# Patient Record
Sex: Female | Born: 1965 | Race: Black or African American | Hispanic: No | Marital: Married | Smoking: Never smoker
Health system: Southern US, Community
[De-identification: ages and names within clinical notes are randomized; demographics above are authoritative.]

## PROBLEM LIST (undated history)

## (undated) DIAGNOSIS — K219 Gastro-esophageal reflux disease without esophagitis: Secondary | ICD-10-CM

## (undated) DIAGNOSIS — J45909 Unspecified asthma, uncomplicated: Secondary | ICD-10-CM

## (undated) DIAGNOSIS — D649 Anemia, unspecified: Secondary | ICD-10-CM

## (undated) DIAGNOSIS — R7303 Prediabetes: Secondary | ICD-10-CM

## (undated) DIAGNOSIS — I10 Essential (primary) hypertension: Secondary | ICD-10-CM

## (undated) DIAGNOSIS — Z9109 Other allergy status, other than to drugs and biological substances: Secondary | ICD-10-CM

## (undated) HISTORY — DX: Anemia, unspecified: D64.9

## (undated) HISTORY — DX: Prediabetes: R73.03

## (undated) HISTORY — PX: UPPER GI ENDOSCOPY: SHX6162

## (undated) HISTORY — PX: OTHER SURGICAL HISTORY: SHX169

---

## 1993-04-22 HISTORY — PX: TUBAL LIGATION: SHX77

## 2001-03-25 ENCOUNTER — Encounter: Admission: RE | Admit: 2001-03-25 | Discharge: 2001-06-23 | Payer: Self-pay | Admitting: Internal Medicine

## 2001-06-26 ENCOUNTER — Emergency Department (HOSPITAL_COMMUNITY): Admission: EM | Admit: 2001-06-26 | Discharge: 2001-06-26 | Payer: Self-pay | Admitting: Emergency Medicine

## 2003-08-26 ENCOUNTER — Ambulatory Visit (HOSPITAL_COMMUNITY): Admission: RE | Admit: 2003-08-26 | Discharge: 2003-08-26 | Payer: Self-pay | Admitting: Obstetrics

## 2003-12-01 ENCOUNTER — Encounter: Admission: RE | Admit: 2003-12-01 | Discharge: 2003-12-01 | Payer: Self-pay | Admitting: Internal Medicine

## 2004-05-03 ENCOUNTER — Encounter: Admission: RE | Admit: 2004-05-03 | Discharge: 2004-05-03 | Payer: Self-pay | Admitting: Internal Medicine

## 2005-03-01 ENCOUNTER — Ambulatory Visit: Payer: Self-pay | Admitting: Internal Medicine

## 2005-03-22 ENCOUNTER — Ambulatory Visit: Payer: Self-pay | Admitting: Internal Medicine

## 2005-12-20 ENCOUNTER — Encounter (INDEPENDENT_AMBULATORY_CARE_PROVIDER_SITE_OTHER): Payer: Self-pay | Admitting: *Deleted

## 2005-12-20 ENCOUNTER — Ambulatory Visit (HOSPITAL_COMMUNITY): Admission: RE | Admit: 2005-12-20 | Discharge: 2005-12-20 | Payer: Self-pay | Admitting: Obstetrics

## 2005-12-30 ENCOUNTER — Emergency Department (HOSPITAL_COMMUNITY): Admission: EM | Admit: 2005-12-30 | Discharge: 2005-12-31 | Payer: Self-pay | Admitting: Emergency Medicine

## 2007-09-02 ENCOUNTER — Emergency Department (HOSPITAL_COMMUNITY): Admission: EM | Admit: 2007-09-02 | Discharge: 2007-09-03 | Payer: Self-pay | Admitting: Emergency Medicine

## 2009-04-22 HISTORY — PX: ABLATION: SHX5711

## 2009-06-16 ENCOUNTER — Ambulatory Visit: Payer: Self-pay | Admitting: Obstetrics

## 2010-06-06 ENCOUNTER — Emergency Department (HOSPITAL_COMMUNITY)
Admission: EM | Admit: 2010-06-06 | Discharge: 2010-06-06 | Disposition: A | Discharge status: Home / Self Care | Attending: Emergency Medicine | Admitting: Emergency Medicine

## 2010-06-06 ENCOUNTER — Emergency Department (HOSPITAL_COMMUNITY)

## 2010-06-06 DIAGNOSIS — N83209 Unspecified ovarian cyst, unspecified side: Secondary | ICD-10-CM | POA: Insufficient documentation

## 2010-06-06 DIAGNOSIS — E119 Type 2 diabetes mellitus without complications: Secondary | ICD-10-CM | POA: Insufficient documentation

## 2010-06-06 DIAGNOSIS — R109 Unspecified abdominal pain: Secondary | ICD-10-CM | POA: Insufficient documentation

## 2010-06-06 LAB — URINE MICROSCOPIC-ADD ON

## 2010-06-06 LAB — BASIC METABOLIC PANEL
BUN: 13 mg/dL (ref 6–23)
CO2: 25 mEq/L (ref 19–32)
Calcium: 9.1 mg/dL (ref 8.4–10.5)
Creatinine, Ser: 0.89 mg/dL (ref 0.4–1.2)
GFR calc non Af Amer: >60 mL/min (ref >60)
Glucose, Bld: 128 mg/dL — ABNORMAL HIGH (ref 70–99)
Sodium: 137 mEq/L (ref 135–145)

## 2010-06-06 LAB — CBC
HCT: 34 % — ABNORMAL LOW (ref 36.0–46.0)
Hemoglobin: 10.5 g/dL — ABNORMAL LOW (ref 12.0–15.0)
MCH: 22.5 pg — ABNORMAL LOW (ref 26.0–34.0)
MCHC: 30.9 g/dL (ref 30.0–36.0)
RDW: 14.2 % (ref 11.5–15.5)

## 2010-06-06 LAB — HEPATIC FUNCTION PANEL
Bilirubin, Direct: 0.1 mg/dL (ref 0.0–0.3)
Indirect Bilirubin: 0.1 mg/dL — ABNORMAL LOW (ref 0.3–0.9)
Total Bilirubin: 0.2 mg/dL — ABNORMAL LOW (ref 0.3–1.2)

## 2010-06-06 LAB — DIFFERENTIAL
Eosinophils Relative: 5 % (ref 0–5)
Lymphs Abs: 1.9 10*3/uL (ref 0.7–4.0)
Monocytes Absolute: 0.3 10*3/uL (ref 0.1–1.0)
Monocytes Relative: 5 % (ref 3–12)
Neutro Abs: 3.2 10*3/uL (ref 1.7–7.7)

## 2010-06-06 LAB — URINALYSIS, ROUTINE W REFLEX MICROSCOPIC
Bilirubin Urine: NEGATIVE
Ketones, ur: NEGATIVE mg/dL
Nitrite: NEGATIVE
Protein, ur: NEGATIVE mg/dL
Specific Gravity, Urine: 1.005 (ref 1.005–1.030)
Urobilinogen, UA: 0.2 mg/dL (ref 0.0–1.0)

## 2010-06-06 LAB — LIPASE, BLOOD: Lipase: 19 U/L (ref 11–59)

## 2010-06-06 LAB — PREGNANCY, URINE: Preg Test, Ur: NEGATIVE

## 2010-06-06 LAB — GLUCOSE, CAPILLARY: Glucose-Capillary: 110 mg/dL — ABNORMAL HIGH (ref 70–99)

## 2010-06-06 MED ORDER — IOHEXOL 300 MG/ML  SOLN
100.0000 mL | Freq: Once | INTRAMUSCULAR | Status: AC | PRN
  Administered 2010-06-06: 100 mL via INTRAVENOUS

## 2010-09-07 NOTE — Op Note (Signed)
NAME:  Rose Scott, Rose Scott              ACCOUNT NO.:  [id number]   MEDICAL RECORD NO.:  [id number]          PATIENT TYPE:  AMB   LOCATION:  SDC                           FACILITY:  [facility]   PHYSICIAN:  Charles A. Clearance Coots, M.D.DATE OF BIRTH:  Nov 22, 1965   DATE OF PROCEDURE:  12/20/2005  DATE OF DISCHARGE:  12/20/2005                                 OPERATIVE REPORT   PREOPERATIVE DIAGNOSIS:  Menorrhagia.   POSTOPERATIVE DIAGNOSIS:  Menorrhagia.   PROCEDURE:  1. Hysteroscopy.  2. D&C.  3. NovaSure bipolar endometrial ablation.   SURGEON:  Charles A. Clearance Coots, M.D.   ANESTHESIA:  General.   COMPLICATIONS:  None.   SPECIMEN:  Endometrial curettings.   OPERATION:  The patient was brought to the operating room and after  satisfactory general endotracheal anesthesia the vagina was prepped and  draped in the usual sterile fashion.  Urinary bladder was emptied of  approximately 200 mL of clear urine.  Bi-manual examination revealed the  uterus to be in mid position.  A sterile speculum was inserted in the  vaginal vault.  The cervix was isolated.  The anterior lip of the cervix was  grasped with a single-tooth tenaculum.  A Hegar dilator was then advanced  through the cervical os to the internal os of the cervical canal to 4 cm.  The uterus was then sounded to 9 cm, leaving a cavity length of 5 cm.  The  cervix was then dilated to a #25 Pratt dilator.  The hysteroscope was  inserted through the cervical os and into the endometrial cavity and a  hysteroscopic survey revealed the endometrial cavity to be clean, no  evidence of polyps or fibroids.  The hysteroscope was removed.  A small  serrated curet was then introduced into the uterine cavity and curettage of  the endometrium was done in routine fashion and specimen was submitted to  Pathology for evaluation.  The NovaSure bipolar instrument was then  introduced into the uterine cavity, into the fundus, and bipolar ablation  was done in  routine fashion at a power of 113 watts for 112 seconds.  The  instrument was then removed.  Follow up hysteroscopic survey of the cavity  revealed adequate ablation.  All instruments were then retired.  The patient  tolerated the procedure well.  He was transported to the recovery room in  satisfactory condition.      Charles A. Clearance Coots, M.D.  Electronically Signed    CAH/MEDQ  D:  12/20/2005  T:  12/22/2005  Job:  [id number]

## 2011-09-24 ENCOUNTER — Other Ambulatory Visit: Payer: Self-pay | Admitting: Obstetrics

## 2011-09-24 DIAGNOSIS — Z1231 Encounter for screening mammogram for malignant neoplasm of breast: Secondary | ICD-10-CM

## 2011-10-17 ENCOUNTER — Ambulatory Visit (HOSPITAL_COMMUNITY)
Admission: RE | Admit: 2011-10-17 | Discharge: 2011-10-17 | Disposition: A | Discharge status: Home / Self Care | Source: Ambulatory Visit | Attending: Obstetrics | Admitting: Obstetrics

## 2011-10-17 DIAGNOSIS — Z1231 Encounter for screening mammogram for malignant neoplasm of breast: Secondary | ICD-10-CM | POA: Insufficient documentation

## 2012-04-21 ENCOUNTER — Ambulatory Visit (INDEPENDENT_AMBULATORY_CARE_PROVIDER_SITE_OTHER): Admitting: Family Medicine

## 2012-04-21 VITALS — BP 129/78 | HR 78 | Temp 98.9°F | Resp 17 | Ht 64.5 in | Wt 194.0 lb

## 2012-04-21 DIAGNOSIS — R05 Cough: Secondary | ICD-10-CM

## 2012-04-21 DIAGNOSIS — R509 Fever, unspecified: Secondary | ICD-10-CM

## 2012-04-21 DIAGNOSIS — R5381 Other malaise: Secondary | ICD-10-CM

## 2012-04-21 DIAGNOSIS — R7302 Impaired glucose tolerance (oral): Secondary | ICD-10-CM

## 2012-04-21 MED ORDER — CEFDINIR 300 MG PO CAPS: 300.0000 mg | ORAL_CAPSULE | Freq: Two times a day (BID) | ORAL | Status: DC

## 2012-04-21 NOTE — Progress Notes (Signed)
[facility] [address], [place] [address] [phone]  Date:  04/21/2012   Name:  Rose Scott   DOB:  1965-09-27   MRN:  [id number]  PCP:  Gwynneth Aliment, MD    Chief Complaint: Generalized Body Aches, Sore Throat, Cough and Dizziness   History of Present Illness:  Rose Scott is a 46 y.o. very pleasant female patient who presents with the following:  She is here today with about a week of flu- like symptoms. She has noted chills, aches, coughing up phlegm, feeling lightheaded. She has noted a possible fever off and on.  She is using mucinex but it makes her feel dizzy- she is not really able to use it regulalry.    Her daughter has been ill with a ST as well  Her glucose at home is running around 113- she last checked it a couple of days ago- history of glucose intolerance but not DM She is a Corporate treasurer  There is no problem list on file for this patient.   History reviewed. No pertinent past medical history.  History reviewed. No pertinent past surgical history.  History  Substance Use Topics  . Smoking status: Never Smoker   . Smokeless tobacco: Not on file  . Alcohol Use: No    History reviewed. No pertinent family history.  No Known Allergies  Medication list has been reviewed and updated.  No current outpatient prescriptions on file prior to visit.    Review of Systems:  As per HPI- otherwise negative.   Physical Examination: Filed Vitals:   04/21/12 1056  BP: 129/78  Pulse: 78  Temp: 98.9 F (37.2 C)  Resp: 17   Filed Vitals:   04/21/12 1056  Height: 5' 4.5" (1.638 m)  Weight: 194 lb (87.998 kg)   Body mass index is 32.79 kg/(m^2). Ideal Body Weight: Weight in (lb) to have BMI = 25: 147.6   GEN: WDWN, NAD, Non-toxic, A & O x 3, overweight HEENT: Atraumatic, Normocephalic. Neck supple. No masses, No LAD. Bilateral TM wnl, oropharynx normal.  PEERL,EOMI.   Ears and Nose: No external  deformity. CV: RRR, No M/G/R. No JVD. No thrill. No extra heart sounds. PULM: CTA B, no wheezes, crackles, rhonchi. No retractions. No resp. distress. No accessory muscle use. ABD: S, NT, ND EXTR: No c/c/e NEURO Normal gait.  PSYCH: Normally interactive. Conversant. Not depressed or anxious appearing.  Calm demeanor.   Results for orders placed in visit on 04/21/12  POCT INFLUENZA A/B      Component Value Range   Influenza A, POC Negative     Influenza B, POC Negative      Assessment and Plan: 1. Malaise  POCT Influenza A/B  2. Glucose intolerance (impaired glucose tolerance)    3. Fever    4. Cough  cefdinir (OMNICEF) 300 MG capsule   Treat for bronchitis with omnicef.  Stop using mucinex (day and night combo cold prep) as it causes dizziness, but can use ibuprofen as needed for aches.    See patient instructions for more details.     Abbe Amsterdam, MD

## 2012-04-21 NOTE — Patient Instructions (Addendum)
Use the antibiotic as directed.  Drink plenty of fluids and let me know if you are not better in the next couple of days- Sooner if worse.

## 2013-01-25 ENCOUNTER — Encounter: Payer: Self-pay | Admitting: Obstetrics

## 2013-01-25 ENCOUNTER — Other Ambulatory Visit: Payer: Self-pay | Admitting: *Deleted

## 2013-01-25 ENCOUNTER — Ambulatory Visit (INDEPENDENT_AMBULATORY_CARE_PROVIDER_SITE_OTHER): Admitting: Obstetrics

## 2013-01-25 VITALS — BP 136/83 | HR 73 | Temp 98.6°F | Ht 64.0 in | Wt 196.2 lb

## 2013-01-25 DIAGNOSIS — Z01419 Encounter for gynecological examination (general) (routine) without abnormal findings: Secondary | ICD-10-CM

## 2013-01-25 DIAGNOSIS — N83209 Unspecified ovarian cyst, unspecified side: Secondary | ICD-10-CM

## 2013-01-25 DIAGNOSIS — Z803 Family history of malignant neoplasm of breast: Secondary | ICD-10-CM

## 2013-01-25 DIAGNOSIS — Z0000 Encounter for general adult medical examination without abnormal findings: Secondary | ICD-10-CM

## 2013-01-25 LAB — HIV ANTIBODY (ROUTINE TESTING W REFLEX): HIV: NONREACTIVE

## 2013-01-25 NOTE — Progress Notes (Signed)
.   Subjective:     Rose Scott is a 47 y.o. female here for a routine exam.  Current complaints: Right sided pain from an ovarian cyst.  She had an ultrasound a couple of weeks ago..  Personal health questionnaire reviewed: no. GAIL risk assessment performed with patient- 5 year risk This woman- 1.7% and average 1.1%   Lifetime risk- this woman 13.9% and average woman 9.2%   Gynecologic History No LMP recorded. Patient has had an ablation. Contraception: none Last Pap: 2012. Results were: normal Last mammogram: 10/2011. Results were: normal  Obstetric History OB History  No data available     The following portions of the patient's history were reviewed and updated as appropriate: allergies, current medications, past family history, past medical history, past social history, past surgical history and problem list.  Review of Systems Pertinent items are noted in HPI.    Objective:    General appearance: alert Breasts: normal appearance, no masses or tenderness Abdomen: soft, non-tender; bowel sounds normal; no masses,  no organomegaly Pelvic: cervix normal in appearance, external genitalia normal, no adnexal masses or tenderness, uterus normal size, shape, and consistency and vagina normal without discharge   Informal U/S: unilateral/unilocular adnexal cyst < 5 cm  Assessment:   Likely functional ovarian cyst   Plan:    Repeat U/S in 6 weeks-->return for f/u after the U/S

## 2013-01-26 LAB — PAP IG, CT-NG, RFX HPV ASCU

## 2013-01-27 ENCOUNTER — Encounter: Payer: Self-pay | Admitting: Obstetrics

## 2013-01-27 NOTE — Patient Instructions (Signed)
Ovarian Cyst  The ovaries are small organs that are on each side of the uterus. The ovaries are the organs that produce the female hormones, estrogen and progesterone. An ovarian cyst is a sac filled with fluid that can vary in its size. It is normal for a small cyst to form in women who are in the childbearing age and who have menstrual periods. This type of cyst is called a follicle cyst that becomes an ovulation cyst (corpus luteum cyst) after it produces the women's egg. It later goes away on its own if the woman does not become pregnant. There are other kinds of ovarian cysts that may cause problems and may need to be treated. The most serious problem is a cyst with cancer. It should be noted that menopausal women who have an ovarian cyst are at a higher risk of it being a cancer cyst. They should be evaluated very quickly, thoroughly and followed closely. This is especially true in menopausal women because of the high rate of ovarian cancer in women in menopause.  CAUSES AND TYPES OF OVARIAN CYSTS:   FUNCTIONAL CYST: The follicle/corpus luteum cyst is a functional cyst that occurs every month during ovulation with the menstrual cycle. They go away with the next menstrual cycle if the woman does not get pregnant. Usually, there are no symptoms with a functional cyst.   ENDOMETRIOMA CYST: This cyst develops from the lining of the uterus tissue. This cyst gets in or on the ovary. It grows every month from the bleeding during the menstrual period. It is also called a "chocolate cyst" because it becomes filled with blood that turns brown. This cyst can cause pain in the lower abdomen during intercourse and with your menstrual period.   CYSTADENOMA CYST: This cyst develops from the cells on the outside of the ovary. They usually are not cancerous. They can get very big and cause lower abdomen pain and pain with intercourse. This type of cyst can twist on itself, cut off its blood supply and cause severe pain. It  also can easily rupture and cause a lot of pain.   DERMOID CYST: This type of cyst is sometimes found in both ovaries. They are found to have different kinds of body tissue in the cyst. The tissue includes skin, teeth, hair, and/or cartilage. They usually do not have symptoms unless they get very big. Dermoid cysts are rarely cancerous.   POLYCYSTIC OVARY: This is a rare condition with hormone problems that produces many small cysts on both ovaries. The cysts are follicle-like cysts that never produce an egg and become a corpus luteum. It can cause an increase in body weight, infertility, acne, increase in body and facial hair and lack of menstrual periods or rare menstrual periods. Many women with this problem develop type 2 diabetes. The exact cause of this problem is unknown. A polycystic ovary is rarely cancerous.   THECA LUTEIN CYST: Occurs when too much hormone (human chorionic gonadotropin) is produced and over-stimulates the ovaries to produce an egg. They are frequently seen when doctors stimulate the ovaries for invitro-fertilization (test tube babies).   LUTEOMA CYST: This cyst is seen during pregnancy. Rarely it can cause an obstruction to the birth canal during labor and delivery. They usually go away after delivery.  SYMPTOMS    Pelvic pain or pressure.   Pain during sexual intercourse.   Increasing girth (swelling) of the abdomen.   Abnormal menstrual periods.   Increasing pain with menstrual periods.     You stop having menstrual periods and you are not pregnant.  DIAGNOSIS   The diagnosis can be made during:   Routine or annual pelvic examination (common).   Ultrasound.   X-ray of the pelvis.   CT Scan.   MRI.   Blood tests.  TREATMENT    Treatment may only be to follow the cyst monthly for 2 to 3 months with your caregiver. Many go away on their own, especially functional cysts.   May be aspirated (drained) with a long needle with ultrasound, or by laparoscopy (inserting a tube into  the pelvis through a small incision).   The whole cyst can be removed by laparoscopy.   Sometimes the cyst may need to be removed through an incision in the lower abdomen.   Hormone treatment is sometimes used to help dissolve certain cysts.   Birth control pills are sometimes used to help dissolve certain cysts.  HOME CARE INSTRUCTIONS   Follow your caregiver's advice regarding:   Medicine.   Follow up visits to evaluate and treat the cyst.   You may need to come back or make an appointment with another caregiver, to find the exact cause of your cyst, if your caregiver is not a gynecologist.   Get your yearly and recommended pelvic examinations and Pap tests.   Let your caregiver know if you have had an ovarian cyst in the past.  SEEK MEDICAL CARE IF:    Your periods are late, irregular, they stop, or are painful.   Your stomach (abdomen) or pelvic pain does not go away.   Your stomach becomes larger or swollen.   You have pressure on your bladder or trouble emptying your bladder completely.   You have painful sexual intercourse.   You have feelings of fullness, pressure, or discomfort in your stomach.   You lose weight for no apparent reason.   You feel generally ill.   You become constipated.   You lose your appetite.   You develop acne.   You have an increase in body and facial hair.   You are gaining weight, without changing your exercise and eating habits.   You think you are pregnant.  SEEK IMMEDIATE MEDICAL CARE IF:    You have increasing abdominal pain.   You feel sick to your stomach (nausea) and/or vomit.   You develop a fever that comes on suddenly.   You develop abdominal pain during a bowel movement.   Your menstrual periods become heavier than usual.  Document Released: 04/08/2005 Document Revised: 07/01/2011 Document Reviewed: 02/09/2009  [employer] Patient Information 2014 [employer]

## 2013-02-26 ENCOUNTER — Other Ambulatory Visit: Payer: Self-pay | Admitting: Obstetrics & Gynecology

## 2013-02-26 DIAGNOSIS — Z1231 Encounter for screening mammogram for malignant neoplasm of breast: Secondary | ICD-10-CM

## 2013-03-05 ENCOUNTER — Ambulatory Visit (HOSPITAL_COMMUNITY): Admission: RE | Admit: 2013-03-05 | Source: Ambulatory Visit

## 2013-03-08 ENCOUNTER — Ambulatory Visit (HOSPITAL_COMMUNITY)
Admission: RE | Admit: 2013-03-08 | Discharge: 2013-03-08 | Disposition: A | Discharge status: Home / Self Care | Source: Ambulatory Visit | Attending: Obstetrics & Gynecology | Admitting: Obstetrics & Gynecology

## 2013-03-08 DIAGNOSIS — N83209 Unspecified ovarian cyst, unspecified side: Secondary | ICD-10-CM | POA: Insufficient documentation

## 2013-03-08 DIAGNOSIS — D259 Leiomyoma of uterus, unspecified: Secondary | ICD-10-CM | POA: Insufficient documentation

## 2013-03-08 DIAGNOSIS — R1032 Left lower quadrant pain: Secondary | ICD-10-CM | POA: Insufficient documentation

## 2013-03-10 ENCOUNTER — Encounter: Payer: Self-pay | Admitting: Obstetrics & Gynecology

## 2013-03-10 DIAGNOSIS — D259 Leiomyoma of uterus, unspecified: Secondary | ICD-10-CM | POA: Insufficient documentation

## 2013-03-10 DIAGNOSIS — N83209 Unspecified ovarian cyst, unspecified side: Secondary | ICD-10-CM | POA: Insufficient documentation

## 2013-03-15 ENCOUNTER — Encounter: Payer: Self-pay | Admitting: Obstetrics

## 2013-03-15 ENCOUNTER — Ambulatory Visit (HOSPITAL_COMMUNITY)
Admission: RE | Admit: 2013-03-15 | Discharge: 2013-03-15 | Disposition: A | Discharge status: Home / Self Care | Source: Ambulatory Visit | Attending: Obstetrics & Gynecology | Admitting: Obstetrics & Gynecology

## 2013-03-15 ENCOUNTER — Ambulatory Visit (INDEPENDENT_AMBULATORY_CARE_PROVIDER_SITE_OTHER): Admitting: Obstetrics

## 2013-03-15 VITALS — BP 138/78 | HR 74 | Temp 97.4°F | Wt 199.0 lb

## 2013-03-15 DIAGNOSIS — Z1231 Encounter for screening mammogram for malignant neoplasm of breast: Secondary | ICD-10-CM | POA: Insufficient documentation

## 2013-03-15 DIAGNOSIS — N83209 Unspecified ovarian cyst, unspecified side: Secondary | ICD-10-CM

## 2013-03-15 NOTE — Progress Notes (Signed)
Subjective:     Rose Scott is a 47 y.o. female here for a routine exam.  Current complaints: follow up to ultrasound performed on 03-08-13. Pt states she was infomred she had a right ovarian cyst.   Personal health questionnaire reviewed: yes.   Gynecologic History No LMP recorded. Patient has had an ablation. Contraception: none   Obstetric History OB History  No data available        Review of Systems Pertinent items are noted in HPI.    Objective:    No exam performed today, Consult only..    Assessment:    Ovarian cyst.   Plan:    Education reviewed: Management of ovarian cysts. Follow up in: 8 weeks. F/U ultrasound ordered.

## 2013-03-16 ENCOUNTER — Encounter: Payer: Self-pay | Admitting: Obstetrics

## 2013-05-17 ENCOUNTER — Ambulatory Visit (HOSPITAL_COMMUNITY)
Admission: RE | Admit: 2013-05-17 | Discharge: 2013-05-17 | Disposition: A | Discharge status: Home / Self Care | Source: Ambulatory Visit | Attending: Obstetrics | Admitting: Obstetrics

## 2013-05-17 ENCOUNTER — Encounter: Payer: Self-pay | Admitting: Obstetrics

## 2013-05-17 ENCOUNTER — Ambulatory Visit (INDEPENDENT_AMBULATORY_CARE_PROVIDER_SITE_OTHER): Admitting: Obstetrics

## 2013-05-17 VITALS — BP 123/70 | HR 73 | Temp 98.6°F | Ht 64.0 in | Wt 201.0 lb

## 2013-05-17 DIAGNOSIS — D649 Anemia, unspecified: Secondary | ICD-10-CM

## 2013-05-17 DIAGNOSIS — D250 Submucous leiomyoma of uterus: Secondary | ICD-10-CM | POA: Insufficient documentation

## 2013-05-17 DIAGNOSIS — N83209 Unspecified ovarian cyst, unspecified side: Secondary | ICD-10-CM

## 2013-05-17 DIAGNOSIS — D251 Intramural leiomyoma of uterus: Secondary | ICD-10-CM | POA: Insufficient documentation

## 2013-05-17 NOTE — Progress Notes (Signed)
Subjective:     Rose Scott is a 48 y.o. female here for a routine exam.  Current complaints: Follow up US results. Patient states she has a concern about multiple myeloma and would like to be evaluated for it.  Personal health questionnaire reviewed: yes.   Gynecologic History No LMP recorded. Patient has had an ablation. Contraception: BTL Last Pap: 01/2013 Results were: normal Last mammogram: 2014. Results were: normal  Obstetric History OB History  No data available     The following portions of the patient's history were reviewed and updated as appropriate: allergies, current medications, past family history, past medical history, past social history, past surgical history and problem list.  Review of Systems Pertinent items are noted in HPI.    Objective:    General appearance: alert and no distress Abdomen: normal findings: soft, non-tender Pelvic: cervix normal in appearance, external genitalia normal, no adnexal masses or tenderness, no cervical motion tenderness, rectovaginal septum normal, uterus normal size, shape, and consistency and vagina normal without discharge    Assessment:    Healthy female exam.    Right ovarian cyst, benign features, asymptomatic.  CA 125 normal.   Plan:    Education reviewed: Management of ovarian cysts.. Follow up in: 1 year. Repeat ultrasound yearly.

## 2013-05-18 LAB — CBC WITH DIFFERENTIAL/PLATELET
Basophils Absolute: 0 10*3/uL (ref 0.0–0.1)
Basophils Relative: 0 % (ref 0–1)
Eosinophils Absolute: 0.1 10*3/uL (ref 0.0–0.7)
Eosinophils Relative: 2 % (ref 0–5)
HEMATOCRIT: 37.6 % (ref 36.0–46.0)
Hemoglobin: 11.5 g/dL — ABNORMAL LOW (ref 12.0–15.0)
LYMPHS ABS: 1.8 10*3/uL (ref 0.7–4.0)
LYMPHS PCT: 33 % (ref 12–46)
MCH: 22.2 pg — ABNORMAL LOW (ref 26.0–34.0)
MCHC: 30.6 g/dL (ref 30.0–36.0)
MCV: 72.7 fL — AB (ref 78.0–100.0)
MONO ABS: 0.2 10*3/uL (ref 0.1–1.0)
Monocytes Relative: 4 % (ref 3–12)
Neutro Abs: 3.3 10*3/uL (ref 1.7–7.7)
Neutrophils Relative %: 61 % (ref 43–77)
PLATELETS: 290 10*3/uL (ref 150–400)
RBC: 5.17 MIL/uL — AB (ref 3.87–5.11)
RDW: 14.7 % (ref 11.5–15.5)
WBC: 5.4 10*3/uL (ref 4.0–10.5)

## 2013-05-18 LAB — COMPREHENSIVE METABOLIC PANEL
ALBUMIN: 4.3 g/dL (ref 3.5–5.2)
ALT: 9 U/L (ref 0–35)
AST: 15 U/L (ref 0–37)
Alkaline Phosphatase: 57 U/L (ref 39–117)
BUN: 12 mg/dL (ref 6–23)
CALCIUM: 9.3 mg/dL (ref 8.4–10.5)
CHLORIDE: 102 meq/L (ref 96–112)
CO2: 26 mEq/L (ref 19–32)
CREATININE: 0.8 mg/dL (ref 0.50–1.10)
Glucose, Bld: 86 mg/dL (ref 70–99)
POTASSIUM: 4.7 meq/L (ref 3.5–5.3)
Sodium: 137 mEq/L (ref 135–145)
Total Bilirubin: 0.4 mg/dL (ref 0.3–1.2)
Total Protein: 7.2 g/dL (ref 6.0–8.3)

## 2013-05-18 LAB — TSH: TSH: 1.468 u[IU]/mL (ref 0.350–4.500)

## 2013-05-20 ENCOUNTER — Encounter: Payer: Self-pay | Admitting: Obstetrics

## 2013-06-28 ENCOUNTER — Ambulatory Visit (HOSPITAL_COMMUNITY)

## 2013-06-28 ENCOUNTER — Ambulatory Visit: Admitting: Obstetrics

## 2013-06-30 ENCOUNTER — Ambulatory Visit: Admitting: Obstetrics

## 2013-07-01 ENCOUNTER — Ambulatory Visit (HOSPITAL_COMMUNITY)
Admission: RE | Admit: 2013-07-01 | Discharge: 2013-07-01 | Disposition: A | Discharge status: Home / Self Care | Source: Ambulatory Visit | Attending: Obstetrics | Admitting: Obstetrics

## 2013-07-01 ENCOUNTER — Ambulatory Visit (HOSPITAL_COMMUNITY)

## 2013-07-01 ENCOUNTER — Encounter: Payer: Self-pay | Admitting: Obstetrics

## 2013-07-01 ENCOUNTER — Ambulatory Visit (INDEPENDENT_AMBULATORY_CARE_PROVIDER_SITE_OTHER): Admitting: Obstetrics

## 2013-07-01 VITALS — BP 152/87 | HR 80 | Temp 98.4°F | Wt 203.0 lb

## 2013-07-01 DIAGNOSIS — N926 Irregular menstruation, unspecified: Secondary | ICD-10-CM | POA: Insufficient documentation

## 2013-07-01 DIAGNOSIS — N939 Abnormal uterine and vaginal bleeding, unspecified: Principal | ICD-10-CM

## 2013-07-01 DIAGNOSIS — D259 Leiomyoma of uterus, unspecified: Secondary | ICD-10-CM | POA: Insufficient documentation

## 2013-07-01 DIAGNOSIS — N83209 Unspecified ovarian cyst, unspecified side: Secondary | ICD-10-CM

## 2013-07-01 NOTE — Progress Notes (Signed)
Subjective:     Rose Scott is a 48 y.o. female here for a follow up u/s exam.  Current complaints: Pt had u/s today and is here to discuss results. Pt states that she is having some dark bleeding.  Personal health questionnaire reviewed: yes.   Gynecologic History No LMP recorded. Patient has had an ablation.   Obstetric History OB History  No data available     The following portions of the patient's history were reviewed and updated as appropriate: allergies, current medications, past family history, past medical history, past social history, past surgical history and problem list.  Review of Systems Pertinent items are noted in HPI.    Objective:    No exam performed today, Consult only..    Assessment:    AUB.  S/P Endometrial Ablation.  Stable.   Simple Cyst of Ovary.  Unchanged.  Normal CA125.  Will follow with yearly ultrasounds and CA125.   Plan:    F/U for Annual.

## 2013-07-06 ENCOUNTER — Encounter: Payer: Self-pay | Admitting: Obstetrics

## 2013-10-18 ENCOUNTER — Other Ambulatory Visit: Payer: Self-pay | Admitting: Nurse Practitioner

## 2013-10-18 DIAGNOSIS — R221 Localized swelling, mass and lump, neck: Secondary | ICD-10-CM

## 2013-10-20 ENCOUNTER — Ambulatory Visit
Admission: RE | Admit: 2013-10-20 | Discharge: 2013-10-20 | Disposition: A | Discharge status: Home / Self Care | Source: Ambulatory Visit | Attending: Nurse Practitioner | Admitting: Nurse Practitioner

## 2013-10-20 DIAGNOSIS — R221 Localized swelling, mass and lump, neck: Secondary | ICD-10-CM

## 2014-01-19 ENCOUNTER — Ambulatory Visit (INDEPENDENT_AMBULATORY_CARE_PROVIDER_SITE_OTHER): Admitting: Family Medicine

## 2014-01-19 VITALS — BP 118/70 | HR 79 | Temp 98.2°F | Resp 16 | Ht 64.0 in | Wt 197.8 lb

## 2014-01-19 DIAGNOSIS — R599 Enlarged lymph nodes, unspecified: Secondary | ICD-10-CM

## 2014-01-19 DIAGNOSIS — D649 Anemia, unspecified: Secondary | ICD-10-CM

## 2014-01-19 DIAGNOSIS — R590 Localized enlarged lymph nodes: Secondary | ICD-10-CM

## 2014-01-19 DIAGNOSIS — J029 Acute pharyngitis, unspecified: Secondary | ICD-10-CM

## 2014-01-19 DIAGNOSIS — K219 Gastro-esophageal reflux disease without esophagitis: Secondary | ICD-10-CM

## 2014-01-19 DIAGNOSIS — R1013 Epigastric pain: Secondary | ICD-10-CM

## 2014-01-19 LAB — POCT CBC
GRANULOCYTE PERCENT: 53.3 % (ref 37–80)
HEMATOCRIT: 34.6 % — AB (ref 37.7–47.9)
HEMOGLOBIN: 10.4 g/dL — AB (ref 12.2–16.2)
Lymph, poc: 2.8 (ref 0.6–3.4)
MCH, POC: 22.3 pg — AB (ref 27–31.2)
MCHC: 30 g/dL — AB (ref 31.8–35.4)
MCV: 74.5 fL — AB (ref 80–97)
MID (cbc): 0.2 (ref 0–0.9)
MPV: 7.9 fL (ref 0–99.8)
POC GRANULOCYTE: 3.4 (ref 2–6.9)
POC LYMPH %: 43.4 % (ref 10–50)
POC MID %: 3.3 %M (ref 0–12)
Platelet Count, POC: 261 10*3/uL (ref 142–424)
RBC: 4.64 M/uL (ref 4.04–5.48)
RDW, POC: 14.4 %
WBC: 6.4 10*3/uL (ref 4.6–10.2)

## 2014-01-19 LAB — POCT URINALYSIS DIPSTICK
Bilirubin, UA: NEGATIVE
Blood, UA: NEGATIVE
Glucose, UA: NEGATIVE
Ketones, UA: NEGATIVE
Leukocytes, UA: NEGATIVE
Nitrite, UA: NEGATIVE
PH UA: 5.5
PROTEIN UA: 30
SPEC GRAV UA: 1.02
Urobilinogen, UA: 0.2

## 2014-01-19 LAB — POCT RAPID STREP A (OFFICE): Rapid Strep A Screen: NEGATIVE

## 2014-01-19 LAB — GLUCOSE, POCT (MANUAL RESULT ENTRY): POC Glucose: 75 mg/dl (ref 70–99)

## 2014-01-19 LAB — POCT UA - MICROSCOPIC ONLY
CRYSTALS, UR, HPF, POC: NEGATIVE
Casts, Ur, LPF, POC: NEGATIVE
MUCUS UA: NEGATIVE
RBC, urine, microscopic: NEGATIVE
WBC, UR, HPF, POC: NEGATIVE
Yeast, UA: NEGATIVE

## 2014-01-19 MED ORDER — GUAIFENESIN ER 1200 MG PO TB12: 1.0000 | ORAL_TABLET | Freq: Two times a day (BID) | ORAL | Status: DC | PRN

## 2014-01-19 MED ORDER — IPRATROPIUM BROMIDE 0.03 % NA SOLN: 2.0000 | Freq: Four times a day (QID) | NASAL | Status: DC

## 2014-01-19 MED ORDER — FLUTICASONE PROPIONATE 50 MCG/ACT NA SUSP: 2.0000 | Freq: Every day | NASAL | Status: DC

## 2014-01-19 MED ORDER — HYDROCOD POLST-CHLORPHEN POLST 10-8 MG/5ML PO LQCR: 5.0000 mL | Freq: Two times a day (BID) | ORAL | Status: DC | PRN

## 2014-01-19 NOTE — Patient Instructions (Addendum)
Hot showers or breathing in steam may help loosen the congestion.  Using a netti pot or sinus rinse is also likely to help you feel better and keep this from progressing.  Use the atrovent nasal spray as needed throughout the day and the fluticasone every night before bed for at least 2 wks.  I recommend augmenting with generic mucinex to help you move out the congestion.  If no improvement or you are getting worse, come back as you might need a course of steroids but hopefully with all of the above, you can avoid it. Use a cool-mist humidifier and breathe in steam every morning to try to get your ears upen your sinuses to drain.   Iron Deficiency Anemia Anemia is a condition in which there are less red blood cells or hemoglobin in the blood than normal. Hemoglobin is the part of red blood cells that carries oxygen. Iron deficiency anemia is anemia caused by too little iron. It is the most common type of anemia. It may leave you tired and short of breath. CAUSES   Lack of iron in the diet.  Poor absorption of iron, as seen with intestinal disorders.  Intestinal bleeding.  Heavy periods. SIGNS AND SYMPTOMS  Mild anemia may not be noticeable. Symptoms may include:  Fatigue.  Headache.  Pale skin.  Weakness.  Tiredness.  Shortness of breath.  Dizziness.  Cold hands and feet.  Fast or irregular heartbeat. DIAGNOSIS  Diagnosis requires a thorough evaluation and physical exam by your health care provider. Blood tests are generally used to confirm iron deficiency anemia. Additional tests may be done to find the underlying cause of your anemia. These may include:  Testing for blood in the stool (fecal occult blood test).  A procedure to see inside the colon and rectum (colonoscopy).  A procedure to see inside the esophagus and stomach (endoscopy). TREATMENT  Iron deficiency anemia is treated by correcting the cause of the deficiency. Treatment may involve:  Adding iron-rich foods  to your diet.  Taking iron supplements. Pregnant or breastfeeding women need to take extra iron because their normal diet usually does not provide the required amount.  Taking vitamins. Vitamin C improves the absorption of iron. Your health care provider may recommend that you take your iron tablets with a glass of orange juice or vitamin C supplement.  Medicines to make heavy menstrual flow lighter.  Surgery. HOME CARE INSTRUCTIONS   Take iron as directed by your health care provider.  If you cannot tolerate taking iron supplements by mouth, talk to your health care provider about taking them through a vein (intravenously) or an injection into a muscle.  For the best iron absorption, iron supplements should be taken on an empty stomach. If you cannot tolerate them on an empty stomach, you may need to take them with food.  Do not drink milk or take antacids at the same time as your iron supplements. Milk and antacids may interfere with the absorption of iron.  Iron supplements can cause constipation. Make sure to include fiber in your diet to prevent constipation. A stool softener may also be recommended.  Take vitamins as directed by your health care provider.  Eat a diet rich in iron. Foods high in iron include liver, lean beef, whole-grain bread, eggs, dried fruit, and dark green leafy vegetables. SEEK IMMEDIATE MEDICAL CARE IF:   You faint. If this happens, do not drive. Call your local emergency services (911 in [place] if no other help is  available.  You have chest pain.  You feel nauseous or vomit.  You have severe or increased shortness of breath with activity.  You feel weak.  You have a rapid heartbeat.  You have unexplained sweating.  You become light-headed when getting up from a chair or bed. MAKE SURE YOU:   Understand these instructions.  Will watch your condition.  Will get help right away if you are not doing well or get worse. Document Released:  04/05/2000 Document Revised: 04/13/2013 Document Reviewed: 12/14/2012 [facility] Patient Information 2015 [place], [place]. This information is not intended to replace advice given to you by your health care provider. Make sure you discuss any questions you have with your health care provider  Iron-Rich Diet An iron-rich diet contains foods that are good sources of iron. Iron is an important mineral that helps your body produce hemoglobin. Hemoglobin is a protein in red blood cells that carries oxygen to the body's tissues. Sometimes, the iron level in your blood can be low. This may be caused by:  A lack of iron in your diet.  Blood loss.  Times of growth, such as during pregnancy or during a child's growth and development. Low levels of iron can cause a decrease in the number of red blood cells. This can result in iron deficiency anemia. Iron deficiency anemia symptoms include:  Tiredness.  Weakness.  Irritability.  Increased chance of infection. Here are some recommendations for daily iron intake:  Males older than 50 years of age need 8 mg of iron per day.  Women ages 43 to 82 need 18 mg of iron per day.  Pregnant women need 27 mg of iron per day, and women who are over 22 years of age and breastfeeding need 9 mg of iron per day.  Women over the age of 41 need 8 mg of iron per day. SOURCES OF IRON There are 2 types of iron that are found in food: heme iron and nonheme iron. Heme iron is absorbed by the body better than nonheme iron. Heme iron is found in meat, poultry, and fish. Nonheme iron is found in grains, beans, and vegetables. Heme Iron Sources Food / Iron (mg)  Chicken liver, 3 oz (85 g)/ 10 mg  Beef liver, 3 oz (85 g)/ 5.5 mg  Oysters, 3 oz (85 g)/ 8 mg  Beef, 3 oz (85 g)/ 2 to 3 mg  Shrimp, 3 oz (85 g)/ 2.8 mg  [place], 3 oz (85 g)/ 2 mg  Chicken, 3 oz (85 g) / 1 mg  Fish (tuna, halibut), 3 oz (85 g)/ 1 mg  Pork, 3 oz (85 g)/ 0.9 mg Nonheme Iron  Sources Food / Iron (mg)  Ready-to-eat breakfast cereal, iron-fortified / 3.9 to 7 mg  Tofu,  cup / 3.4 mg  Kidney beans,  cup / 2.6 mg  Baked potato with skin / 2.7 mg  Asparagus,  cup / 2.2 mg  Avocado / 2 mg  Dried peaches,  cup / 1.6 mg  Raisins,  cup / 1.5 mg  Soy milk, 1 cup / 1.5 mg  Whole-wheat bread, 1 slice / 1.2 mg  Spinach, 1 cup / 0.8 mg  Broccoli,  cup / 0.6 mg IRON ABSORPTION Certain foods can decrease the body's absorption of iron. Try to avoid these foods and beverages while eating meals with iron-containing foods:  Coffee.  Tea.  Fiber.  Soy. Foods containing vitamin C can help increase the amount of iron your body absorbs from iron  sources, especially from nonheme sources. Eat foods with vitamin C along with iron-containing foods to increase your iron absorption. Foods that are high in vitamin C include many fruits and vegetables. Some good sources are:  Fresh orange juice.  Oranges.  Strawberries.  Mangoes.  Grapefruit.  Red bell peppers.  Green bell peppers.  Broccoli.  Potatoes with skin.  Tomato juice. Document Released: 11/20/2004 Document Revised: 07/01/2011 Document Reviewed: 09/27/2010 [facility] Patient Information 2015 [place], [place]. This information is not intended to replace advice given to you by your health care provider. Make sure you discuss any questions you have with your health care provider. Pharyngitis Pharyngitis is redness, pain, and swelling (inflammation) of your pharynx.  CAUSES  Pharyngitis is usually caused by infection. Most of the time, these infections are from viruses (viral) and are part of a cold. However, sometimes pharyngitis is caused by bacteria (bacterial). Pharyngitis can also be caused by allergies. Viral pharyngitis may be spread from person to person by coughing, sneezing, and personal items or utensils (cups, forks, spoons, toothbrushes). Bacterial pharyngitis may be spread from person  to person by more intimate contact, such as kissing.  SIGNS AND SYMPTOMS  Symptoms of pharyngitis include:   Sore throat.   Tiredness (fatigue).   Low-grade fever.   Headache.  Joint pain and muscle aches.  Skin rashes.  Swollen lymph nodes.  Plaque-like film on throat or tonsils (often seen with bacterial pharyngitis). DIAGNOSIS  Your health care provider will ask you questions about your illness and your symptoms. Your medical history, along with a physical exam, is often all that is needed to diagnose pharyngitis. Sometimes, a rapid strep test is done. Other lab tests may also be done, depending on the suspected cause.  TREATMENT  Viral pharyngitis will usually get better in 3-4 days without the use of medicine. Bacterial pharyngitis is treated with medicines that kill germs (antibiotics).  HOME CARE INSTRUCTIONS   Drink enough water and fluids to keep your urine clear or pale yellow.   Only take over-the-counter or prescription medicines as directed by your health care provider:   If you are prescribed antibiotics, make sure you finish them even if you start to feel better.   Do not take aspirin.   Get lots of rest.   Gargle with 8 oz of salt water ( tsp of salt per 1 qt of water) as often as every 1-2 hours to soothe your throat.   Throat lozenges (if you are not at risk for choking) or sprays may be used to soothe your throat. SEEK MEDICAL CARE IF:   You have large, tender lumps in your neck.  You have a rash.  You cough up green, yellow-brown, or bloody spit. SEEK IMMEDIATE MEDICAL CARE IF:   Your neck becomes stiff.  You drool or are unable to swallow liquids.  You vomit or are unable to keep medicines or liquids down.  You have severe pain that does not go away with the use of recommended medicines.  You have trouble breathing (not caused by a stuffy nose). MAKE SURE YOU:   Understand these instructions.  Will watch your  condition.  Will get help right away if you are not doing well or get worse. Document Released: 04/08/2005 Document Revised: 01/27/2013 Document Reviewed: 12/14/2012 [facility] Patient Information 2015 [place], [place]. This information is not intended to replace advice given to you by your health care provider. Make sure you discuss any questions you have with your health care provider.

## 2014-01-19 NOTE — Progress Notes (Signed)
Subjective:  This chart was scribed for Rose Cheadle, MD by Rose Scott, ED Scribe. The patient was seen in room 9. Patient's care was started at 12:04 PM.   Patient ID: Rose Scott, female    DOB: [birth year], 48 y.o.   MRN: [id number]  Chief Complaint  Patient presents with  . Neck Pain    pt states her neck hurts x2 weeks; pt states she believes she had the flu  . Fatigue    pt states she feels very weak x2 weeks  . Arm Pain    right arm pain x2 weeks  . Chills    denies fever   HPI HPI Comments: Rose Scott is a 48 y.o. female who presents to the [facility] complaining of constant fatigue over the past 2 weeks. Pt states that she was sick last week, started feeling better 2 days ago but states that the symptoms have returned. She reports associated chills, HA in forehead, light-headedness, R sinus pressure, swollen glands, sore throat, productive cough with clear sputum, postnasal drip, bilateral ear pain, R worse than L, nausea, myalgias, abdominal pain, heartburn. Last BM this morning. She denies fever, diaphoresis, rash, SOB, chest pain, vomiting, change in bowels or urine. Pt has tried Mucinex DM with temporary relief. She also tried Sudafed but states that she experienced side effects with it. Pt reports sick contacts at work with strep throat.   Past Medical History  Diagnosis Date  . Pre-diabetes   . Anemia    No current outpatient prescriptions on file prior to visit.   No current facility-administered medications on file prior to visit.   No Known Allergies  Review of Systems  Constitutional: Positive for chills and fatigue. Negative for fever.  HENT: Positive for ear pain, postnasal drip, sinus pressure and sore throat.   Respiratory: Positive for cough. Negative for shortness of breath.   Cardiovascular: Negative for chest pain.  Gastrointestinal: Positive for nausea and abdominal pain. Negative for vomiting, diarrhea, constipation and  blood in stool.  Genitourinary: Negative for hematuria, decreased urine volume and difficulty urinating.  Musculoskeletal: Positive for myalgias and neck pain.  Skin: Negative for rash.  Neurological: Positive for light-headedness and headaches.   Triage Vitals: BP 118/70  Pulse 79  Temp(Src) 98.2 F (36.8 C) (Oral)  Resp 16  Ht 5\' 4"  (1.626 m)  Wt 197 lb 12.8 oz (89.721 kg)  BMI 33.94 kg/m2  SpO2 100%    Objective:   Physical Exam  Nursing note and vitals reviewed. Constitutional: She is oriented to person, place, and time. She appears well-developed and well-nourished. No distress.  HENT:  Head: Normocephalic and atraumatic.  Right Ear: Tympanic membrane is retracted. A middle ear effusion is present.  Left Ear: Tympanic membrane is retracted. A middle ear effusion is present.  Nose: Mucosal edema present.  Mouth/Throat: Posterior oropharyngeal erythema present. No oropharyngeal exudate or posterior oropharyngeal edema.  Mid ear effusion, R worse than L Pale, boggy nasal mucosa  Eyes: Conjunctivae and EOM are normal.  Neck: Neck supple. No tracheal deviation present.  Cardiovascular: Normal rate, regular rhythm and normal heart sounds.   Pulmonary/Chest: Effort normal and breath sounds normal. No respiratory distress.  Abdominal: Soft. She exhibits no distension. There is tenderness in the epigastric area. There is CVA tenderness (R).  Generalized tenderness, worse in epigastric   Musculoskeletal: Normal range of motion.  Lymphadenopathy:       Head (right side): Submandibular and tonsillar adenopathy present.  Head (left side): Submandibular and tonsillar adenopathy present.    She has cervical adenopathy.       Right: No supraclavicular adenopathy present.       Left: No supraclavicular adenopathy present.  Anterior cervical adenopathy bilaterally   Neurological: She is alert and oriented to person, place, and time.  Skin: Skin is warm and dry.  Psychiatric: She  has a normal mood and affect. Her behavior is normal.      Results for orders placed in visit on 01/19/14  POCT CBC      Result Value Ref Range   WBC 6.4  4.6 - 10.2 K/uL   Lymph, poc 2.8  0.6 - 3.4   POC LYMPH PERCENT 43.4  10 - 50 %L   MID (cbc) 0.2  0 - 0.9   POC MID % 3.3  0 - 12 %M   POC Granulocyte 3.4  2 - 6.9   Granulocyte percent 53.3  37 - 80 %G   RBC 4.64  4.04 - 5.48 M/uL   Hemoglobin 10.4 (*) 12.2 - 16.2 g/dL   HCT, POC 34.6 (*) 37.7 - 47.9 %   MCV 74.5 (*) 80 - 97 fL   MCH, POC 22.3 (*) 27 - 31.2 pg   MCHC 30.0 (*) 31.8 - 35.4 g/dL   RDW, POC 14.4     Platelet Count, POC 261  142 - 424 K/uL   MPV 7.9  0 - 99.8 fL  POCT UA - MICROSCOPIC ONLY      Result Value Ref Range   WBC, Ur, HPF, POC neg     RBC, urine, microscopic neg     Bacteria, U Microscopic trace     Mucus, UA neg     Epithelial cells, urine per micros 0-2     Crystals, Ur, HPF, POC neg     Casts, Ur, LPF, POC neg     Yeast, UA neg    POCT URINALYSIS DIPSTICK      Result Value Ref Range   Color, UA yellow     Clarity, UA clear     Glucose, UA neg     Bilirubin, UA neg     Ketones, UA neg     Spec Grav, UA 1.020     Blood, UA neg     pH, UA 5.5     Protein, UA 30     Urobilinogen, UA 0.2     Nitrite, UA neg     Leukocytes, UA Negative    POCT RAPID STREP A (OFFICE)      Result Value Ref Range   Rapid Strep A Screen Negative  Negative  GLUCOSE, POCT (MANUAL RESULT ENTRY)      Result Value Ref Range   POC Glucose 75  70 - 99 mg/dl    Assessment & Plan:   Acute pharyngitis, unspecified pharyngitis type - Plan: POCT rapid strep A, Throat culture ([facility]) - suspect sxs due to PND but GERD may be contributing - rec symptomatic care for now  Adenopathy, cervical - Plan: POCT CBC  Abdominal pain, epigastric - Plan: POCT UA - Microscopic Only, POCT urinalysis dipstick, POCT glucose (manual entry) - suspect due to GERD  Gastroesophageal reflux disease, esophagitis presence not  specified  Anemia, unspecified - Plan: Ferritin, Iron and TIBC - pt has not had menses is >4 yrs due to endometrial ablation  Meds ordered this encounter  Medications  . chlorpheniramine-HYDROcodone (TUSSIONEX PENNKINETIC ER) 10-8 MG/5ML [facility]  Sig: Take 5 mLs by mouth every 12 (twelve) hours as needed.    Dispense:  90 mL    Refill:  0  . Guaifenesin (MUCINEX MAXIMUM STRENGTH) 1200 MG TB12    Sig: Take 1 tablet (1,200 mg total) by mouth every 12 (twelve) hours as needed.    Dispense:  14 tablet    Refill:  1  . ipratropium (ATROVENT) 0.03 % nasal spray    Sig: Place 2 sprays into the nose 4 (four) times daily.    Dispense:  30 mL    Refill:  1  . fluticasone (FLONASE) 50 MCG/ACT nasal spray    Sig: Place 2 sprays into both nostrils at bedtime.    Dispense:  16 g    Refill:  2    I personally performed the services described in this documentation, which was scribed in my presence. The recorded information has been reviewed and considered, and addended by me as needed.  Rose Cheadle, MD MPH

## 2014-01-20 LAB — IRON AND TIBC
%SAT: 22 % (ref 20–55)
Iron: 60 ug/dL (ref 42–145)
TIBC: 269 ug/dL (ref 250–470)
UIBC: 209 ug/dL (ref 125–400)

## 2014-01-20 LAB — FERRITIN: Ferritin: 175 ng/mL (ref 10–291)

## 2014-01-21 LAB — CULTURE, GROUP A STREP: Organism ID, Bacteria: NORMAL

## 2014-01-26 ENCOUNTER — Encounter: Payer: Self-pay | Admitting: Family Medicine

## 2014-03-10 ENCOUNTER — Ambulatory Visit: Admitting: Obstetrics

## 2014-10-11 IMAGING — US US SOFT TISSUE HEAD/NECK
1 series · 14 of 25 positions shown · non-contrast
Comparison: None.

CLINICAL DATA: Neck swelling

EXAM:
THYROID ULTRASOUND
TECHNIQUE: Ultrasound examination of the thyroid gland and adjacent soft
tissues was performed.

[Series 1: us soft tissue head/neck · 0.07mm/px · 14 of 52 slices shown]
[im 1/52]
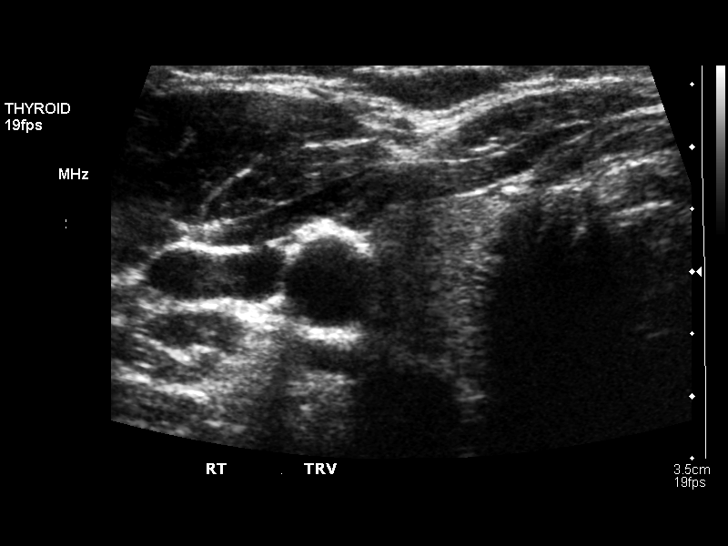
[im 5/52]
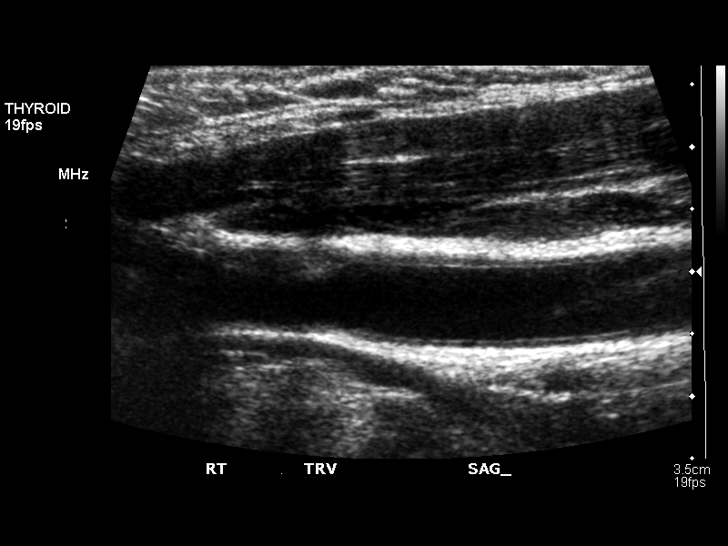
[im 9/52]
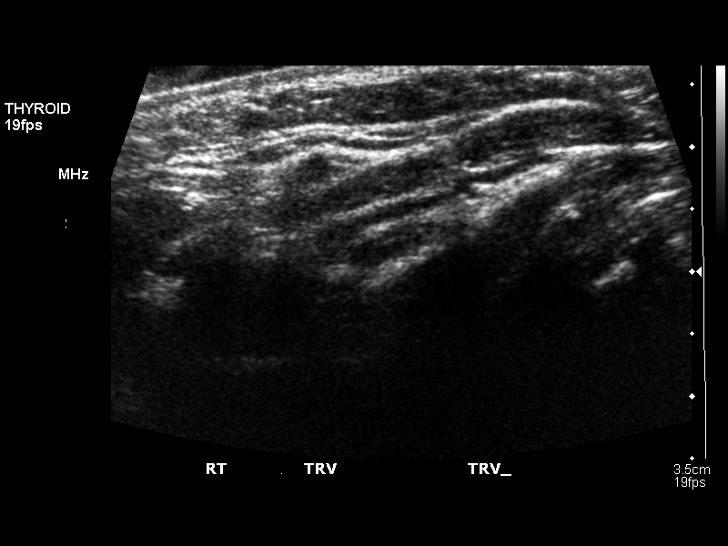
[im 13/52]
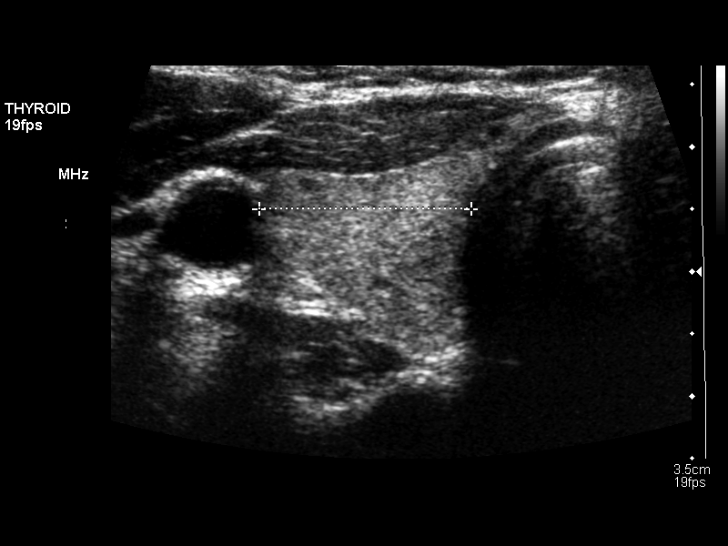
[im 18/52]
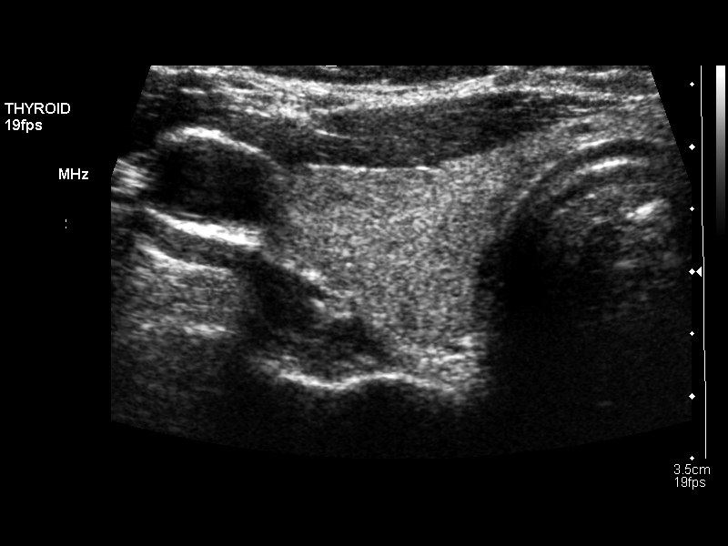
[im 20/52]
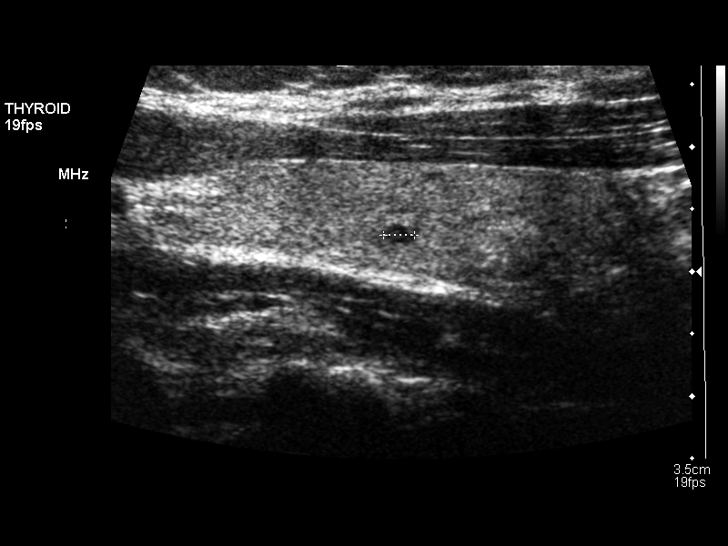
[im 24/52]
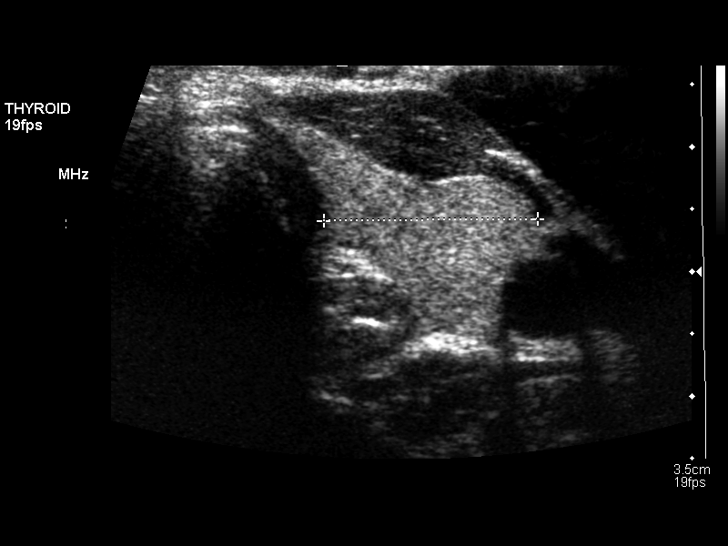
[im 28/52]
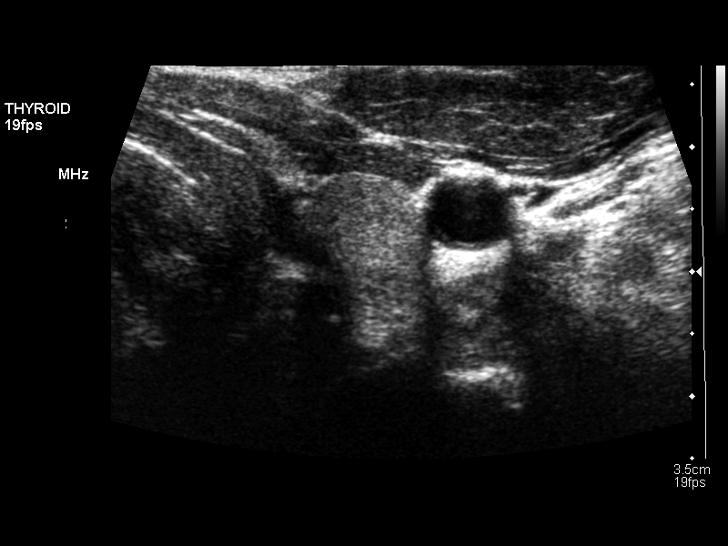
[im 32/52]
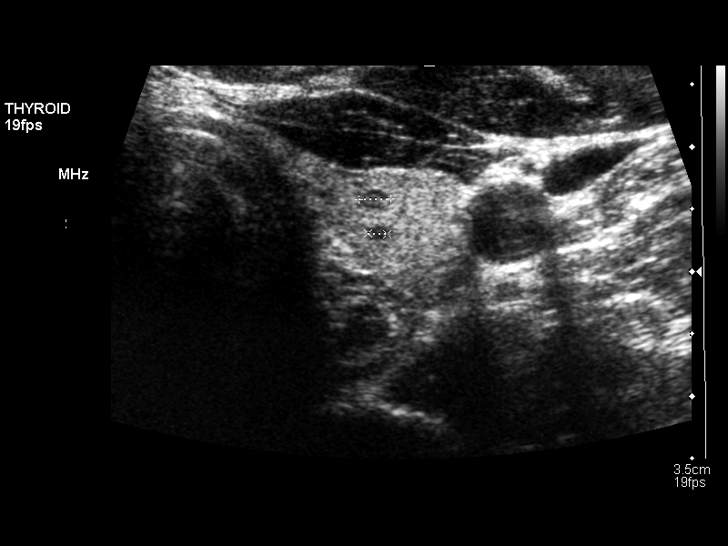
[im 35/52]
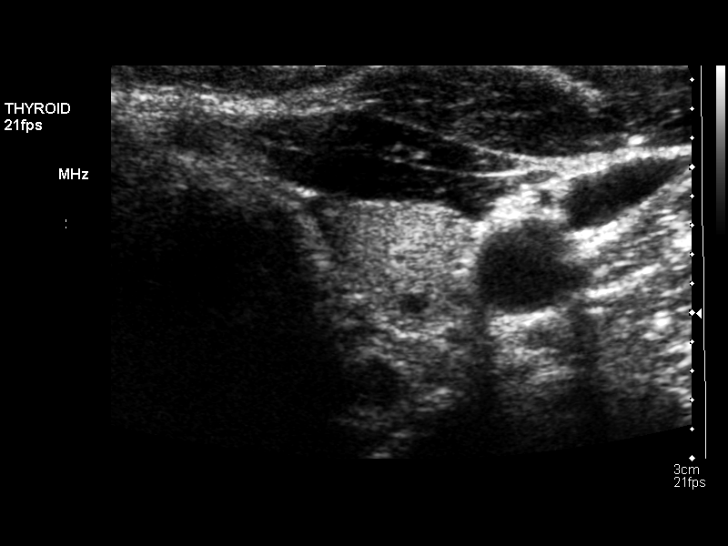
[im 39/52]
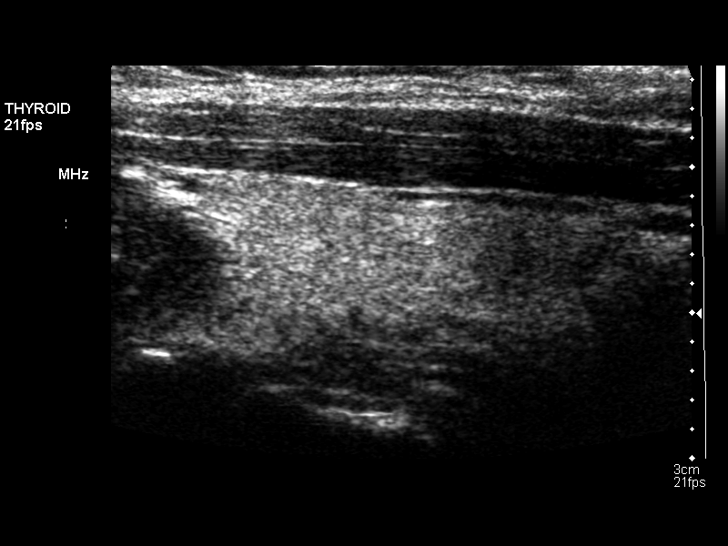
[im 43/52]
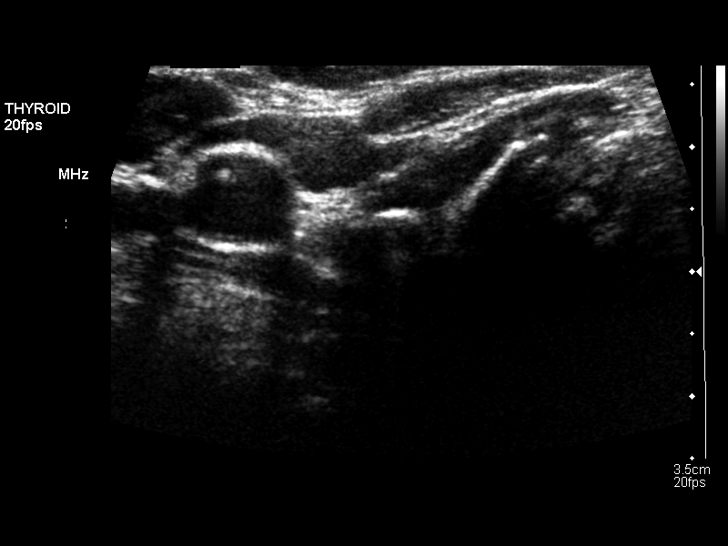
[im 47/52]
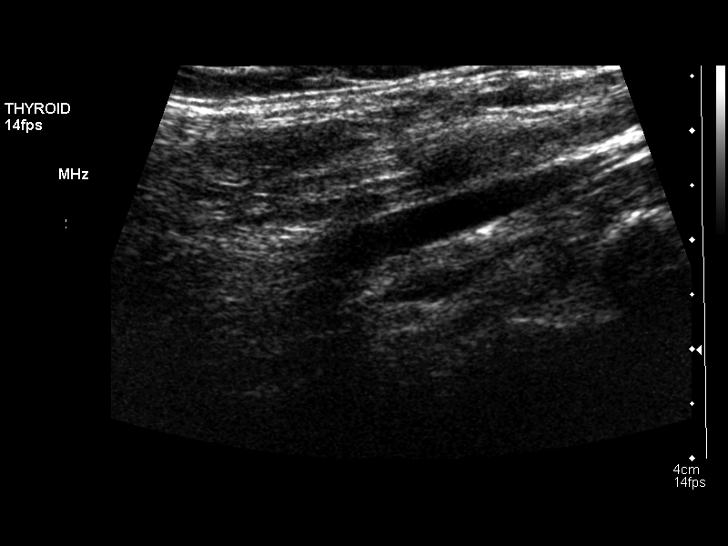
[im 52/52]
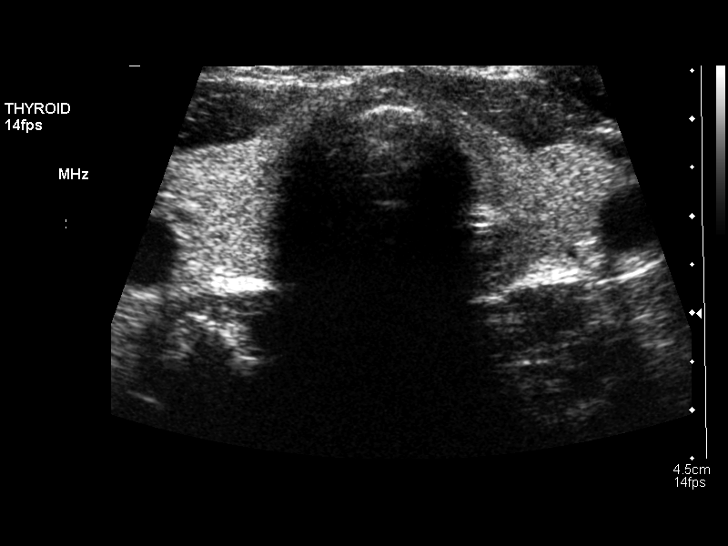

[14 of 25 positions shown; findings below may reference images not displayed]

FINDINGS: Right thyroid lobe

Measurements: 4.1 x 1.2 x 1.7 cm. 3 mm solid interpolar region
nodule.

Left thyroid lobe

Measurements: 4.0 x 1.5 x 1.7 cm. 2 mm upper pole nodule. 3 mm
interpolar region and lower pole nodules.

Isthmus

Thickness: 3 mm.  No nodules visualized.

Lymphadenopathy

None visualized.
IMPRESSION: Small bilateral nodules as described. Findings do not meet current
SRU consensus criteria for biopsy. Follow-up by clinical exam is
recommended. If patient has known risk factors for thyroid
carcinoma, consider follow-up ultrasound in 12 months. If patient is
clinically hyperthyroid, consider nuclear medicine thyroid uptake
and scan.Reference: Management of Thyroid Nodules Detected at US:
Society of Radiologists in Ultrasound Consensus Conference

## 2014-12-21 ENCOUNTER — Encounter: Payer: Self-pay | Admitting: Obstetrics

## 2014-12-21 ENCOUNTER — Ambulatory Visit (INDEPENDENT_AMBULATORY_CARE_PROVIDER_SITE_OTHER): Admitting: Obstetrics

## 2014-12-21 VITALS — BP 154/89 | HR 96 | Temp 98.4°F | Ht 64.0 in | Wt 208.0 lb

## 2014-12-21 DIAGNOSIS — Z124 Encounter for screening for malignant neoplasm of cervix: Secondary | ICD-10-CM | POA: Diagnosis not present

## 2014-12-21 DIAGNOSIS — N898 Other specified noninflammatory disorders of vagina: Secondary | ICD-10-CM

## 2014-12-21 DIAGNOSIS — Z1239 Encounter for other screening for malignant neoplasm of breast: Secondary | ICD-10-CM

## 2014-12-21 DIAGNOSIS — Z01419 Encounter for gynecological examination (general) (routine) without abnormal findings: Secondary | ICD-10-CM

## 2014-12-21 NOTE — Addendum Note (Signed)
Addended by: Lewie Loron D on: 12/21/2014 01:02 PM   Modules accepted: Orders

## 2014-12-21 NOTE — Progress Notes (Signed)
Subjective:        Rose Scott is a 49 y.o. female here for a routine exam.  Current complaints: Malodorous vaginal discharge after sex .    Personal health questionnaire:  Is patient Ashkenazi Jewish, have a family history of breast and/or ovarian cancer: no Is there a family history of uterine cancer diagnosed at age < 33, gastrointestinal cancer, urinary tract cancer, family member who is a Field seismologist syndrome-associated carrier: no Is the patient overweight and hypertensive, family history of diabetes, personal history of gestational diabetes, preeclampsia or PCOS: no Is patient over 63, have PCOS,  family history of premature CHD under age 64, diabetes, smoke, have hypertension or peripheral artery disease:  no At any time, has a partner hit, kicked or otherwise hurt or frightened you?: no Over the past 2 weeks, have you felt down, depressed or hopeless?: no Over the past 2 weeks, have you felt little interest or pleasure in doing things?:no   Gynecologic History No LMP recorded. Patient has had an ablation. Contraception: tubal ligation Last Pap: 2015. Results were: normal Last mammogram: 2014. Results were: normal  Obstetric History OB History  No data available    Past Medical History  Diagnosis Date  . Pre-diabetes   . Anemia     Past Surgical History  Procedure Laterality Date  . Tubal ligation    . Ablation       Current outpatient prescriptions:  .  chlorpheniramine-HYDROcodone (TUSSIONEX PENNKINETIC ER) 10-8 MG/5ML [facility], Take 5 mLs by mouth every 12 (twelve) hours as needed., Disp: 90 mL, Rfl: 0 .  Guaifenesin (MUCINEX MAXIMUM STRENGTH) 1200 MG TB12, Take 1 tablet (1,200 mg total) by mouth every 12 (twelve) hours as needed., Disp: 14 tablet, Rfl: 1 .  fluticasone (FLONASE) 50 MCG/ACT nasal spray, Place 2 sprays into both nostrils at bedtime. (Patient not taking: Reported on 12/21/2014), Disp: 16 g, Rfl: 2 .  ipratropium (ATROVENT) 0.03 % nasal spray, Place 2  sprays into the nose 4 (four) times daily. (Patient not taking: Reported on 12/21/2014), Disp: 30 mL, Rfl: 1 No Known Allergies  Social History  Substance Use Topics  . Smoking status: Never Smoker   . Smokeless tobacco: Not on file  . Alcohol Use: No    Family History  Problem Relation Age of Onset  . Diabetes Mother   . Heart disease Father   . Diabetes Maternal Grandmother   . Hypertension Maternal Grandmother   . Diabetes Maternal Grandfather   . Diabetes Paternal Grandmother   . Diabetes Paternal Grandfather       Review of Systems  Constitutional: negative for fatigue and weight loss Respiratory: negative for cough and wheezing Cardiovascular: negative for chest pain, fatigue and palpitations Gastrointestinal: negative for abdominal pain and change in bowel habits Musculoskeletal:negative for myalgias Neurological: negative for gait problems and tremors Behavioral/Psych: negative for abusive relationship, depression Endocrine: negative for temperature intolerance   Genitourinary:negative for abnormal menstrual periods, genital lesions, hot flashes, sexual problems.  Positive for vaginal discharge with odor after sex Integument/breast: negative for breast lump, breast tenderness, nipple discharge and skin lesion(s)    Objective:       BP 154/89 mmHg  Pulse 96  Temp(Src) 98.4 F (36.9 C)  Ht 5\' 4"  (1.626 m)  Wt 208 lb (94.348 kg)  BMI 35.69 kg/m2 General:   alert  Skin:   no rash or abnormalities  Lungs:   clear to auscultation bilaterally  Heart:   regular rate and rhythm, S1,  S2 normal, no murmur, click, rub or gallop  Breasts:   normal without suspicious masses, skin or nipple changes or axillary nodes  Abdomen:  normal findings: no organomegaly, soft, non-tender and no hernia  Pelvis:  External genitalia: normal general appearance Urinary system: urethral meatus normal and bladder without fullness, nontender Vaginal: normal without tenderness, induration or  masses Cervix: normal appearance Adnexa: normal bimanual exam Uterus: anteverted and non-tender, normal size   Lab Review Urine pregnancy test Labs reviewed yes Radiologic studies reviewed yes    Assessment:    Healthy female exam.    Plan:    Education reviewed: calcium supplements, low fat, low cholesterol diet, self breast exams and weight bearing exercise. Mammogram ordered. Follow up in: 1 year.   No orders of the defined types were placed in this encounter.   Orders Placed This Encounter  Procedures  . MM Digital Screening    Standing Status: Future     Number of Occurrences:      Standing Expiration Date: 02/20/2016    Order Specific Question:  Reason for Exam (SYMPTOM  OR DIAGNOSIS REQUIRED)    Answer:  screening    Order Specific Question:  Is the patient pregnant?    Answer:  No    Order Specific Question:  Preferred imaging location?    Answer:  [facility]

## 2014-12-23 LAB — PAP, TP IMAGING W/ HPV RNA, RFLX HPV TYPE 16,18/45: HPV mRNA, High Risk: NOT DETECTED

## 2014-12-24 LAB — SURESWAB, VAGINOSIS/VAGINITIS PLUS
Atopobium vaginae: 7.8 Log (cells/mL)
C. ALBICANS, DNA: NOT DETECTED
C. GLABRATA, DNA: NOT DETECTED
C. parapsilosis, DNA: NOT DETECTED
C. trachomatis RNA, TMA: NOT DETECTED
C. tropicalis, DNA: NOT DETECTED
LACTOBACILLUS SPECIES: NOT DETECTED Log (cells/mL)
MEGASPHAERA SPECIES: 8 Log (cells/mL)
N. gonorrhoeae RNA, TMA: NOT DETECTED
T. VAGINALIS RNA, QL TMA: NOT DETECTED

## 2014-12-25 ENCOUNTER — Other Ambulatory Visit: Payer: Self-pay | Admitting: Obstetrics

## 2014-12-25 DIAGNOSIS — N760 Acute vaginitis: Principal | ICD-10-CM

## 2014-12-25 DIAGNOSIS — B9689 Other specified bacterial agents as the cause of diseases classified elsewhere: Secondary | ICD-10-CM

## 2014-12-25 MED ORDER — METRONIDAZOLE 500 MG PO TABS: 500.0000 mg | ORAL_TABLET | Freq: Two times a day (BID) | ORAL | Status: DC

## 2014-12-30 ENCOUNTER — Ambulatory Visit (HOSPITAL_COMMUNITY)
Admission: RE | Admit: 2014-12-30 | Discharge: 2014-12-30 | Disposition: A | Discharge status: Home / Self Care | Source: Ambulatory Visit | Attending: Obstetrics | Admitting: Obstetrics

## 2014-12-30 ENCOUNTER — Other Ambulatory Visit: Payer: Self-pay | Admitting: Obstetrics

## 2014-12-30 DIAGNOSIS — Z1231 Encounter for screening mammogram for malignant neoplasm of breast: Secondary | ICD-10-CM | POA: Insufficient documentation

## 2014-12-30 DIAGNOSIS — Z1239 Encounter for other screening for malignant neoplasm of breast: Secondary | ICD-10-CM

## 2015-12-26 ENCOUNTER — Encounter: Payer: Self-pay | Admitting: Obstetrics

## 2015-12-26 ENCOUNTER — Encounter: Payer: Self-pay | Admitting: *Deleted

## 2015-12-26 ENCOUNTER — Ambulatory Visit (INDEPENDENT_AMBULATORY_CARE_PROVIDER_SITE_OTHER): Admitting: Obstetrics

## 2015-12-26 VITALS — BP 149/81 | HR 78 | Ht 64.0 in | Wt 206.0 lb

## 2015-12-26 DIAGNOSIS — N951 Menopausal and female climacteric states: Secondary | ICD-10-CM

## 2015-12-26 DIAGNOSIS — Z01419 Encounter for gynecological examination (general) (routine) without abnormal findings: Secondary | ICD-10-CM | POA: Diagnosis not present

## 2015-12-26 NOTE — Progress Notes (Signed)
Patient ID: Rose Scott, female   DOB: [birth year], 50 y.o.   MRN: [id number]   Subjective:        Gwenivere Boak is a 50 y.o. female here for a routine exam.  Current complaints: None.    Personal health questionnaire:  Is patient Ashkenazi Jewish, have a family history of breast and/or ovarian cancer: no Is there a family history of uterine cancer diagnosed at age < 7, gastrointestinal cancer, urinary tract cancer, family member who is a Field seismologist syndrome-associated carrier: no Is the patient overweight and hypertensive, family history of diabetes, personal history of gestational diabetes, preeclampsia or PCOS: no Is patient over 40, have PCOS,  family history of premature CHD under age 5, diabetes, smoke, have hypertension or peripheral artery disease:  no At any time, has a partner hit, kicked or otherwise hurt or frightened you?: no Over the past 2 weeks, have you felt down, depressed or hopeless?: no Over the past 2 weeks, have you felt little interest or pleasure in doing things?:no   Gynecologic History No LMP recorded. Patient has had an ablation. Contraception: tubal ligation Last Pap: 2016. Results were: normal Last mammogram: 2016. Results were: normal  Obstetric History OB History  No data available    Past Medical History:  Diagnosis Date  . Anemia   . Pre-diabetes     Past Surgical History:  Procedure Laterality Date  . ABLATION    . TUBAL LIGATION       Current Outpatient Prescriptions:  .  chlorpheniramine-HYDROcodone (TUSSIONEX PENNKINETIC ER) 10-8 MG/5ML LQCR, Take 5 mLs by mouth every 12 (twelve) hours as needed. (Patient not taking: Reported on 12/26/2015), Disp: 90 mL, Rfl: 0 .  fluticasone (FLONASE) 50 MCG/ACT nasal spray, Place 2 sprays into both nostrils at bedtime. (Patient not taking: Reported on 12/21/2014), Disp: 16 g, Rfl: 2 .  Guaifenesin (MUCINEX MAXIMUM STRENGTH) 1200 MG TB12, Take 1 tablet (1,200 mg total) by mouth every 12 (twelve)  hours as needed. (Patient not taking: Reported on 12/26/2015), Disp: 14 tablet, Rfl: 1 .  ipratropium (ATROVENT) 0.03 % nasal spray, Place 2 sprays into the nose 4 (four) times daily. (Patient not taking: Reported on 12/21/2014), Disp: 30 mL, Rfl: 1 No Known Allergies  Social History  Substance Use Topics  . Smoking status: Never Smoker  . Smokeless tobacco: Never Used  . Alcohol use Yes     Comment: rare occ    Family History  Problem Relation Age of Onset  . Diabetes Mother   . Heart disease Father   . Diabetes Maternal Grandmother   . Hypertension Maternal Grandmother   . Diabetes Maternal Grandfather   . Diabetes Paternal Grandmother   . Diabetes Paternal Grandfather       Review of Systems  Constitutional: negative for fatigue and weight loss Respiratory: negative for cough and wheezing Cardiovascular: negative for chest pain, fatigue and palpitations Gastrointestinal: negative for abdominal pain and change in bowel habits Musculoskeletal:negative for myalgias Neurological: negative for gait problems and tremors Behavioral/Psych: negative for abusive relationship, depression Endocrine: negative for temperature intolerance   Genitourinary:negative for abnormal menstrual periods, genital lesions, hot flashes, sexual problems and vaginal discharge Integument/breast: negative for breast lump, breast tenderness, nipple discharge and skin lesion(s)    Objective:       BP (!) 149/81   Pulse 78   Ht 5\' 4"  (1.626 m)   Wt 206 lb (93.4 kg)   BMI 35.36 kg/m  General:   alert  Skin:  no rash or abnormalities  Lungs:   clear to auscultation bilaterally  Heart:   regular rate and rhythm, S1, S2 normal, no murmur, click, rub or gallop  Breasts:   normal without suspicious masses, skin or nipple changes or axillary nodes  Abdomen:  normal findings: no organomegaly, soft, non-tender and no hernia  Pelvis:  External genitalia: normal general appearance Urinary system: urethral  meatus normal and bladder without fullness, nontender Vaginal: normal without tenderness, induration or masses Cervix: normal appearance Adnexa: normal bimanual exam Uterus: anteverted and non-tender, normal size   Lab Review Urine pregnancy test Labs reviewed yes Radiologic studies reviewed yes  50% of 20 min visit spent on counseling and coordination of care.   Assessment:    Healthy female exam.    Plan:    Education reviewed: calcium supplements, low fat, low cholesterol diet, self breast exams and weight bearing exercise. Follow up in: 1 year.   No orders of the defined types were placed in this encounter.  No orders of the defined types were placed in this encounter.

## 2015-12-28 LAB — PAP IG AND HPV HIGH-RISK
HPV, high-risk: NEGATIVE
PAP Smear Comment: 0

## 2015-12-30 LAB — NUSWAB VG, CANDIDA 6SP
CANDIDA PARAPSILOSIS, NAA: NEGATIVE
Candida albicans, NAA: NEGATIVE
Candida glabrata, NAA: NEGATIVE
Candida krusei, NAA: NEGATIVE
Candida lusitaniae, NAA: NEGATIVE
Candida tropicalis, NAA: NEGATIVE
Trich vag by NAA: NEGATIVE

## 2016-02-19 ENCOUNTER — Other Ambulatory Visit: Payer: Self-pay | Admitting: Obstetrics

## 2016-02-19 ENCOUNTER — Ambulatory Visit
Admission: RE | Admit: 2016-02-19 | Discharge: 2016-02-19 | Disposition: A | Discharge status: Home / Self Care | Source: Ambulatory Visit | Attending: Obstetrics | Admitting: Obstetrics

## 2016-02-19 DIAGNOSIS — Z1231 Encounter for screening mammogram for malignant neoplasm of breast: Secondary | ICD-10-CM

## 2016-05-22 ENCOUNTER — Ambulatory Visit (INDEPENDENT_AMBULATORY_CARE_PROVIDER_SITE_OTHER): Admitting: Podiatry

## 2016-05-22 ENCOUNTER — Encounter: Payer: Self-pay | Admitting: Podiatry

## 2016-05-22 VITALS — Ht 64.0 in | Wt 202.0 lb

## 2016-05-22 DIAGNOSIS — M79671 Pain in right foot: Secondary | ICD-10-CM

## 2016-05-22 DIAGNOSIS — M24573 Contracture, unspecified ankle: Secondary | ICD-10-CM | POA: Diagnosis not present

## 2016-05-22 DIAGNOSIS — M79672 Pain in left foot: Secondary | ICD-10-CM

## 2016-05-22 DIAGNOSIS — B351 Tinea unguium: Secondary | ICD-10-CM | POA: Diagnosis not present

## 2016-05-22 DIAGNOSIS — M722 Plantar fascial fibromatosis: Secondary | ICD-10-CM | POA: Diagnosis not present

## 2016-05-22 NOTE — Progress Notes (Signed)
SUBJECTIVE: 51 y.o. year old female presents complaining of pain under arches and 5th toes on both feet. Patient also request for diabetic shoes.  Both feet are very painful after a prolonged walking and standing at work. Stated that her blood sugar is under control.  REVIEW OF SYSTEMS: Pertinent items noted in HPI and remainder of comprehensive ROS otherwise negative.  OBJECTIVE: DERMATOLOGIC EXAMINATION: Deformed nail 5th digit distal lateral bilateral, painful. Thick discolored dystrophic nails x 10.  VASCULAR EXAMINATION OF LOWER LIMBS: All pedal pulses are palpable with normal pulsation.  Capillary Filling times within 3 seconds in all digits.  No edema or erythema noted. Temperature gradient from tibial crest to dorsum of foot is within normal bilateral.  NEUROLOGIC EXAMINATION OF THE LOWER LIMBS: Achilles DTR is present and within normal. Monofilament (Semmes-Weinstein 10-gm) sensory testing positive 6 out of 6, bilateral. Vibratory sensations(128Hz  turning fork) intact at medial and lateral forefoot bilateral.  Sharp and Dull discriminatory sensations at the plantar ball of hallux is intact bilateral.   MUSCULOSKELETAL EXAMINATION: Positive for elevated first ray bilateral. Tight Achilles tendon bilateral. Plantar arch pain with weight bearing. Forefoot varus with loading of forefoot with rearfoot in neutral.  ASSESSMENT: Onychomycosis x 10. Plantar fasciitis bilateral. Ankle Equinus bilateral. Forefoot varus.  PLAN: Reviewed clinical findings and available treatment options. All nails and painful lesions debrided. Reviewed stretch exercise for tight Achilles tendon. Patient is to exercise daily. May benefit from diabetic shoes. If arch pain continues after using diabetic shoes, will try custom orthotics. Return in 3 months for RFC.

## 2016-05-22 NOTE — Patient Instructions (Signed)
Seen for painful toe and arch of both feet.  Fungus on nails are mild. Need be stay off of nail polish and keep them clean and dry. Deformed 5th toe nail debrided and return as needed for trimming. All nails debrided. Noted of tight Achilles tendon that is causing arch pain. Need daily stretch exercise as instructed. Both feet measured for diabetic shoes. If pain persist after using diabetic shoes, need to try custom orthotics. Return in 3 month for Routine foot care.

## 2016-08-19 ENCOUNTER — Ambulatory Visit: Admitting: Podiatry

## 2017-02-06 ENCOUNTER — Encounter: Payer: Self-pay | Admitting: Obstetrics

## 2017-02-06 ENCOUNTER — Ambulatory Visit (INDEPENDENT_AMBULATORY_CARE_PROVIDER_SITE_OTHER): Admitting: Obstetrics

## 2017-02-06 VITALS — BP 164/81 | HR 88 | Ht 64.0 in | Wt 194.0 lb

## 2017-02-06 DIAGNOSIS — R300 Dysuria: Secondary | ICD-10-CM

## 2017-02-06 DIAGNOSIS — Z01419 Encounter for gynecological examination (general) (routine) without abnormal findings: Secondary | ICD-10-CM

## 2017-02-06 DIAGNOSIS — B379 Candidiasis, unspecified: Secondary | ICD-10-CM | POA: Diagnosis not present

## 2017-02-06 DIAGNOSIS — Z113 Encounter for screening for infections with a predominantly sexual mode of transmission: Secondary | ICD-10-CM

## 2017-02-06 LAB — POCT URINALYSIS DIPSTICK
Bilirubin, UA: NEGATIVE
Blood, UA: NEGATIVE
Glucose, UA: NEGATIVE
Ketones, UA: NEGATIVE
Nitrite, UA: NEGATIVE
PROTEIN UA: 2
Spec Grav, UA: 1.015 (ref 1.010–1.025)
Urobilinogen, UA: 0.2 E.U./dL
pH, UA: 6 (ref 5.0–8.0)

## 2017-02-06 MED ORDER — CLOTRIMAZOLE 1 % EX CREA: 1.0000 "application " | TOPICAL_CREAM | Freq: Two times a day (BID) | CUTANEOUS | 2 refills | Status: DC

## 2017-02-06 NOTE — Progress Notes (Signed)
Subjective:        Rose Scott is a 51 y.o. female here for a routine exam.  Current complaints: Vaginal irritaion.    Personal health questionnaire:  Is patient Ashkenazi Jewish, have a family history of breast and/or ovarian cancer: no Is there a family history of uterine cancer diagnosed at age < 83, gastrointestinal cancer, urinary tract cancer, family member who is a Field seismologist syndrome-associated carrier: no Is the patient overweight and hypertensive, family history of diabetes, personal history of gestational diabetes, preeclampsia or PCOS: no Is patient over 33, have PCOS,  family history of premature CHD under age 38, diabetes, smoke, have hypertension or peripheral artery disease:  no At any time, has a partner hit, kicked or otherwise hurt or frightened you?: no Over the past 2 weeks, have you felt down, depressed or hopeless?: no Over the past 2 weeks, have you felt little interest or pleasure in doing things?:no   Gynecologic History No LMP recorded. Patient has had an ablation. Contraception: tubal ligation Last Pap: 2017. Results were: normal Last mammogram: 2017. Results were: normal  Obstetric History OB History  No data available    Past Medical History:  Diagnosis Date  . Anemia   . Pre-diabetes     Past Surgical History:  Procedure Laterality Date  . ABLATION    . TUBAL LIGATION       Current Outpatient Prescriptions:  .  clotrimazole (LOTRIMIN) 1 % cream, Apply 1 application topically 2 (two) times daily., Disp: 60 g, Rfl: 2 No Known Allergies  Social History  Substance Use Topics  . Smoking status: Never Smoker  . Smokeless tobacco: Never Used  . Alcohol use Yes     Comment: rare occ    Family History  Problem Relation Age of Onset  . Diabetes Mother   . Heart disease Father   . Diabetes Maternal Grandmother   . Hypertension Maternal Grandmother   . Diabetes Maternal Grandfather   . Diabetes Paternal Grandmother   . Diabetes Paternal  Grandfather       Review of Systems  Constitutional: negative for fatigue and weight loss Respiratory: negative for cough and wheezing Cardiovascular: negative for chest pain, fatigue and palpitations Gastrointestinal: negative for abdominal pain and change in bowel habits Musculoskeletal:negative for myalgias Neurological: negative for gait problems and tremors Behavioral/Psych: negative for abusive relationship, depression Endocrine: negative for temperature intolerance    Genitourinary:negative for abnormal menstrual periods, genital lesions, hot flashes, sexual problems and vaginal discharge Integument/breast: negative for breast lump, breast tenderness, nipple discharge and skin lesion(s)    Objective:       BP (!) 164/81   Pulse 88   Ht 5\' 4"  (1.626 m)   Wt 194 lb (88 kg)   BMI 33.30 kg/m  General:   alert  Skin:   no rash or abnormalities  Lungs:   clear to auscultation bilaterally  Heart:   regular rate and rhythm, S1, S2 normal, no murmur, click, rub or gallop  Breasts:   normal without suspicious masses, skin or nipple changes or axillary nodes  Abdomen:  normal findings: no organomegaly, soft, non-tender and no hernia  Pelvis:  External genitalia: normal general appearance Urinary system: urethral meatus normal and bladder without fullness, nontender Vaginal: normal without tenderness, induration or masses Cervix: normal appearance Adnexa: normal bimanual exam Uterus: anteverted and non-tender, normal size   Lab Review Urine pregnancy test Labs reviewed yes Radiologic studies reviewed yes  50% of 20 min visit spent  on counseling and coordination of care.    Assessment:     1. Encounter for routine gynecological examination with Papanicolaou smear of cervix Rx: - Cytology - PAP - Cervicovaginal ancillary only  2. Dysuria Rx: - POCT Urinalysis Dipstick - Urine Culture  3. Candida albicans infection Rx: - clotrimazole (LOTRIMIN) 1 % cream; Apply 1  application topically 2 (two) times daily.  Dispense: 60 g; Refill: 2   Plan:    Education reviewed: calcium supplements, depression evaluation, low fat, low cholesterol diet, self breast exams and weight bearing exercise. Follow up in: 1 year.   Meds ordered this encounter  Medications  . clotrimazole (LOTRIMIN) 1 % cream    Sig: Apply 1 application topically 2 (two) times daily.    Dispense:  60 g    Refill:  2   Orders Placed This Encounter  Procedures  . Urine Culture  . POCT Urinalysis Dipstick

## 2017-02-07 LAB — CERVICOVAGINAL ANCILLARY ONLY
BACTERIAL VAGINITIS: NEGATIVE
Candida vaginitis: POSITIVE — AB
Trichomonas: NEGATIVE

## 2017-02-07 LAB — CYTOLOGY - PAP
DIAGNOSIS: NEGATIVE
HPV: NOT DETECTED

## 2017-02-08 ENCOUNTER — Other Ambulatory Visit: Payer: Self-pay | Admitting: Obstetrics

## 2017-02-08 DIAGNOSIS — B3731 Acute candidiasis of vulva and vagina: Secondary | ICD-10-CM

## 2017-02-08 DIAGNOSIS — B373 Candidiasis of vulva and vagina: Secondary | ICD-10-CM

## 2017-02-08 LAB — URINE CULTURE

## 2017-02-08 MED ORDER — FLUCONAZOLE 150 MG PO TABS: 150.0000 mg | ORAL_TABLET | Freq: Once | ORAL | 0 refills | Status: AC

## 2017-02-11 ENCOUNTER — Telehealth: Payer: Self-pay | Admitting: Obstetrics

## 2017-02-11 DIAGNOSIS — B3731 Acute candidiasis of vulva and vagina: Secondary | ICD-10-CM

## 2017-02-11 DIAGNOSIS — B373 Candidiasis of vulva and vagina: Secondary | ICD-10-CM

## 2017-02-11 MED ORDER — FLUCONAZOLE 150 MG PO TABS: 150.0000 mg | ORAL_TABLET | Freq: Once | ORAL | 0 refills | Status: AC

## 2017-02-11 NOTE — Telephone Encounter (Signed)
Patient returned call to office.  Notified her of yeast infection.  Patient states she did pick up her Rx, but that she usually needs two doses of Diflucan to fully treat.  One refill routed to pharmacy.

## 2017-04-11 ENCOUNTER — Emergency Department (HOSPITAL_BASED_OUTPATIENT_CLINIC_OR_DEPARTMENT_OTHER)
Admission: EM | Admit: 2017-04-11 | Discharge: 2017-04-11 | Disposition: A | Discharge status: Home / Self Care | Attending: Emergency Medicine | Admitting: Emergency Medicine

## 2017-04-11 ENCOUNTER — Other Ambulatory Visit: Payer: Self-pay

## 2017-04-11 ENCOUNTER — Encounter (HOSPITAL_BASED_OUTPATIENT_CLINIC_OR_DEPARTMENT_OTHER): Payer: Self-pay | Admitting: Emergency Medicine

## 2017-04-11 ENCOUNTER — Emergency Department (HOSPITAL_BASED_OUTPATIENT_CLINIC_OR_DEPARTMENT_OTHER)

## 2017-04-11 DIAGNOSIS — M79604 Pain in right leg: Secondary | ICD-10-CM | POA: Diagnosis not present

## 2017-04-11 DIAGNOSIS — Z79899 Other long term (current) drug therapy: Secondary | ICD-10-CM | POA: Diagnosis not present

## 2017-04-11 DIAGNOSIS — M79601 Pain in right arm: Secondary | ICD-10-CM

## 2017-04-11 DIAGNOSIS — R51 Headache: Secondary | ICD-10-CM | POA: Diagnosis not present

## 2017-04-11 DIAGNOSIS — R519 Headache, unspecified: Secondary | ICD-10-CM

## 2017-04-11 LAB — COMPREHENSIVE METABOLIC PANEL
ALBUMIN: 4 g/dL (ref 3.5–5.0)
ALT: 15 U/L (ref 14–54)
ANION GAP: 7 (ref 5–15)
AST: 22 U/L (ref 15–41)
Alkaline Phosphatase: 62 U/L (ref 38–126)
BUN: 20 mg/dL (ref 6–20)
CO2: 24 mmol/L (ref 22–32)
Calcium: 9 mg/dL (ref 8.9–10.3)
Chloride: 106 mmol/L (ref 101–111)
Creatinine, Ser: 0.96 mg/dL (ref 0.44–1.00)
GFR calc non Af Amer: >60 mL/min (ref >60)
GLUCOSE: 108 mg/dL — AB (ref 65–99)
POTASSIUM: 4.2 mmol/L (ref 3.5–5.1)
SODIUM: 137 mmol/L (ref 135–145)
TOTAL PROTEIN: 7.5 g/dL (ref 6.5–8.1)
Total Bilirubin: 0.4 mg/dL (ref 0.3–1.2)

## 2017-04-11 LAB — CBC
HCT: 36.2 % (ref 36.0–46.0)
Hemoglobin: 11.3 g/dL — ABNORMAL LOW (ref 12.0–15.0)
MCH: 22.7 pg — ABNORMAL LOW (ref 26.0–34.0)
MCHC: 31.2 g/dL (ref 30.0–36.0)
MCV: 72.7 fL — ABNORMAL LOW (ref 78.0–100.0)
Platelets: 276 10*3/uL (ref 150–400)
RBC: 4.98 MIL/uL (ref 3.87–5.11)
RDW: 14.5 % (ref 11.5–15.5)
WBC: 6.9 10*3/uL (ref 4.0–10.5)

## 2017-04-11 LAB — TROPONIN I: Troponin I: <0.03 ng/mL (ref <0.03)

## 2017-04-11 MED ORDER — GABAPENTIN 100 MG PO CAPS: 100.0000 mg | ORAL_CAPSULE | Freq: Three times a day (TID) | ORAL | 0 refills | Status: DC

## 2017-04-11 MED ORDER — BUTALBITAL-APAP-CAFFEINE 50-325-40 MG PO TABS: 1.0000 | ORAL_TABLET | Freq: Four times a day (QID) | ORAL | 0 refills | Status: DC | PRN

## 2017-04-11 NOTE — ED Provider Notes (Signed)
[facility]PART[facility] Provider Note   CSN: [id number] Arrival date & time: 04/11/17  1817     History   Chief Complaint Chief Complaint  Patient presents with  . Headache    HPI Rose Scott is a 51 y.o. female.  HPI   51 year old female with history of prediabetes, anemia sent here from urgent care for strokelike symptoms.  Patient reports since yesterday morning she has been having recurrent headache.  She describes headache as a sharp shooting pain involving the right side of her head that lasts for seconds and comes sporadically throughout the day.  Headache is mild at this time.  No report of vision changes, confusion, or URI symptoms.  Furthermore, she report intermittent tingling sensation to the fingertips of her right hand for the past week.  Also endorsed throbbing pain to her right arm and right leg for the same duration.  She feels that her right leg is more swollen than the left.  No history of DVT.  No history of tobacco or alcohol abuse.  Pain is minimal at this time.    Past Medical History:  Diagnosis Date  . Anemia   . Pre-diabetes     Patient Active Problem List   Diagnosis Date Noted  . Unspecified disorder of menstruation and other abnormal bleeding from female genital tract 07/01/2013  . Leiomyoma of uterus, unspecified 03/10/2013  . Other and unspecified ovarian cyst 03/10/2013  . Glucose intolerance (impaired glucose tolerance) 04/21/2012    Past Surgical History:  Procedure Laterality Date  . ABLATION    . TUBAL LIGATION      OB History    No data available       Home Medications    Prior to Admission medications   Medication Sig Start Date End Date Taking? Authorizing Provider  clotrimazole (LOTRIMIN) 1 % cream Apply 1 application topically 2 (two) times daily. 02/06/17   Shelly Bombard, MD    Family History Family History  Problem Relation Age of Onset  . Diabetes Mother   . Heart disease Father   .  Diabetes Maternal Grandmother   . Hypertension Maternal Grandmother   . Diabetes Maternal Grandfather   . Diabetes Paternal Grandmother   . Diabetes Paternal Grandfather     Social History Social History   Tobacco Use  . Smoking status: Never Smoker  . Smokeless tobacco: Never Used  Substance Use Topics  . Alcohol use: Yes    Comment: rare occ  . Drug use: No     Allergies   Patient has no known allergies.   Review of Systems Review of Systems  All other systems reviewed and are negative.    Physical Exam Updated Vital Signs Pulse 72   Temp 98.5 F (36.9 C) (Oral)   Resp 16   Ht 5\' 4"  (1.626 m)   Wt 87.1 kg (192 lb)   SpO2 100%   BMI 32.96 kg/m   Physical Exam  Constitutional: She is oriented to person, place, and time. She appears well-developed and well-nourished. No distress.  HENT:  Head: Atraumatic.  Mouth/Throat: Oropharynx is clear and moist.  Eyes: Conjunctivae and EOM are normal. Pupils are equal, round, and reactive to light.  Neck: Normal range of motion. Neck supple. No neck rigidity. No tracheal deviation present.  Cardiovascular: Normal rate and regular rhythm.  Pulmonary/Chest: Effort normal and breath sounds normal. No stridor. She has no wheezes. She has no rales.  Abdominal: Soft. There is no tenderness.  Musculoskeletal: Normal range of motion. She exhibits tenderness (Right upper extremity: Mild tenderness throughout entire arm without focal point tenderness overlying skin changes, radial pulse 2+, normal grip strength and full range of motion.  Right lower extremity mild tenderness throughout the entire extremity ). She exhibits no edema.  Lymphadenopathy:    She has no cervical adenopathy.  Neurological: She is alert and oriented to person, place, and time. GCS eye subscore is 4. GCS verbal subscore is 5. GCS motor subscore is 6.  Skin: No rash noted.  Psychiatric: She has a normal mood and affect.  Nursing note and vitals  reviewed.    ED Treatments / Results  Labs (all labs ordered are listed, but only abnormal results are displayed) Labs Reviewed  CBC - Abnormal; Notable for the following components:      Result Value   Hemoglobin 11.3 (*)    MCV 72.7 (*)    MCH 22.7 (*)    All other components within normal limits  COMPREHENSIVE METABOLIC PANEL - Abnormal; Notable for the following components:   Glucose, Bld 108 (*)    All other components within normal limits  TROPONIN I    EKG  EKG Interpretation  Date/Time:  Friday April 11 2017 18:42:40 EST Ventricular Rate:  70 PR Interval:  118 QRS Duration: 66 QT Interval:  386 QTC Calculation: 416 R Axis:   4 Text Interpretation:  Sinus rhythm with Premature supraventricular complexes Otherwise normal ECG No previous ECGs available Confirmed by Gareth Morgan [phone]) on 04/11/2017 9:18:17 PM       Radiology Ct Head Wo Contrast  Result Date: 04/11/2017 CLINICAL DATA:  51 year old female with chronic headache. Right-sided tingling. EXAM: CT HEAD WITHOUT CONTRAST TECHNIQUE: Contiguous axial images were obtained from the base of the skull through the vertex without intravenous contrast. COMPARISON:  None. FINDINGS: Brain: The ventricles and sulci appropriate size for patient's age. Minimal periventricular and deep white matter chronic microvascular ischemic changes may be present. There is no acute intracranial hemorrhage. No mass effect or midline shift. No extra-axial fluid collection. Vascular: No hyperdense vessel or unexpected calcification. Skull: Normal. Negative for fracture or focal lesion. Sinuses/Orbits: No acute finding. Other: None IMPRESSION: No acute intracranial pathology. Electronically Signed   By: Anner Crete M.D.   On: 04/11/2017 19:26    Procedures Procedures (including critical care time)  Medications Ordered in ED Medications - No data to display   Initial Impression / Assessment and Plan / ED Course  I have  reviewed the triage vital signs and the nursing notes.  Pertinent labs & imaging results that were available during my care of the patient were reviewed by me and considered in my medical decision making (see chart for details).     BP (!) 145/81 (BP Location: Right Arm)   Pulse 72   Temp 98.5 F (36.9 C) (Oral)   Resp 16   Ht 5\' 4"  (1.626 m)   Wt 87.1 kg (192 lb)   SpO2 100%   BMI 32.96 kg/m    Final Clinical Impressions(s) / ED Diagnoses   Final diagnoses:  Bad headache  Pain in right arm  Leg pain, right    ED Discharge Orders        Ordered    butalbital-acetaminophen-caffeine (FIORICET, ESGIC) 50-325-40 MG tablet  Every 6 hours PRN     04/11/17 2206    gabapentin (NEURONTIN) 100 MG capsule  3 times daily     04/11/17 2206  10:01 PM Patient here with intermittent pain to the right side of her parietal scalp.  On exam, no overlying skin changes but pain is reproducible.  Head CT scan is unremarkable.  She also complaining of pain to her right upper extremity and right lower extremity for the past week.  Examination unremarkable mild tenderness to the right side however she has no focal neuro deficit, sensation is intact, normal strength throughout.  No evidence to suggest infection causing symptoms.  Low suspicion for stroke causing her symptoms well.  I doubt that she has multiple sclerosis.  However, I do encourage patient to follow-up outpatient for further evaluation of her condition.  Her workup today has been unremarkable.  Care discussed with Dr. Billy Fischer.      Domenic Moras, PA-C 04/11/17 2208    Gareth Morgan, MD 04/12/17 2059

## 2017-04-11 NOTE — Discharge Instructions (Signed)
Please follow up with a neurologist for further evaluation of your condition.  Take fioricet for headache and take gabapentin for your nerve pain.  Return if you have any concerns.

## 2017-04-11 NOTE — ED Triage Notes (Signed)
Patient states that she was just seen by urgent care and send over for symotms of a pre stroke  - The patient states that she has a Headahe  - numbness to her right finger tips, arm pain, leg pain and "dragging of her leg"

## 2017-04-29 ENCOUNTER — Other Ambulatory Visit: Payer: Self-pay | Admitting: Obstetrics

## 2017-04-29 DIAGNOSIS — Z139 Encounter for screening, unspecified: Secondary | ICD-10-CM

## 2017-05-09 ENCOUNTER — Encounter: Payer: Self-pay | Admitting: Neurology

## 2017-05-09 ENCOUNTER — Ambulatory Visit (INDEPENDENT_AMBULATORY_CARE_PROVIDER_SITE_OTHER): Admitting: Neurology

## 2017-05-09 VITALS — BP 131/73 | HR 68 | Ht 64.0 in | Wt 201.0 lb

## 2017-05-09 DIAGNOSIS — R404 Transient alteration of awareness: Secondary | ICD-10-CM | POA: Diagnosis not present

## 2017-05-09 DIAGNOSIS — G5713 Meralgia paresthetica, bilateral lower limbs: Secondary | ICD-10-CM

## 2017-05-09 DIAGNOSIS — G5603 Carpal tunnel syndrome, bilateral upper limbs: Secondary | ICD-10-CM

## 2017-05-09 DIAGNOSIS — R29898 Other symptoms and signs involving the musculoskeletal system: Secondary | ICD-10-CM | POA: Diagnosis not present

## 2017-05-09 DIAGNOSIS — R202 Paresthesia of skin: Secondary | ICD-10-CM

## 2017-05-09 NOTE — Patient Instructions (Signed)
EMG/NCS: schedule at the front desk MRI of the brain: will call for this   Carpal Tunnel Syndrome Carpal tunnel syndrome is a condition that causes pain in your hand and arm. The carpal tunnel is a narrow area located on the palm side of your wrist. Repeated wrist motion or certain diseases may cause swelling within the tunnel. This swelling pinches the main nerve in the wrist (median nerve). What are the causes? This condition may be caused by:  Repeated wrist motions.  Wrist injuries.  Arthritis.  A cyst or tumor in the carpal tunnel.  Fluid buildup during pregnancy.  Sometimes the cause of this condition is not known. What increases the risk? This condition is more likely to develop in:  People who have jobs that cause them to repeatedly move their wrists in the same motion, such as Art gallery manager.  Women.  People with certain conditions, such as: ? Diabetes. ? Obesity. ? An underactive thyroid (hypothyroidism). ? Kidney failure.  What are the signs or symptoms? Symptoms of this condition include:  A tingling feeling in your fingers, especially in your thumb, index, and middle fingers.  Tingling or numbness in your hand.  An aching feeling in your entire arm, especially when your wrist and elbow are bent for long periods of time.  Wrist pain that goes up your arm to your shoulder.  Pain that goes down into your palm or fingers.  A weak feeling in your hands. You may have trouble grabbing and holding items.  Your symptoms may feel worse during the night. How is this diagnosed? This condition is diagnosed with a medical history and physical exam. You may also have tests, including:  An electromyogram (EMG). This test measures electrical signals sent by your nerves into the muscles.  X-rays.  How is this treated? Treatment for this condition includes:  Lifestyle changes. It is important to stop doing or modify the activity that caused your  condition.  Physical or occupational therapy.  Medicines for pain and inflammation. This may include medicine that is injected into your wrist.  A wrist splint.  Surgery.  Follow these instructions at home: If you have a splint:  Wear it as told by your health care provider. Remove it only as told by your health care provider.  Loosen the splint if your fingers become numb and tingle, or if they turn cold and blue.  Keep the splint clean and dry. General instructions  Take over-the-counter and prescription medicines only as told by your health care provider.  Rest your wrist from any activity that may be causing your pain. If your condition is work related, talk to your employer about changes that can be made, such as getting a wrist pad to use while typing.  If directed, apply ice to the painful area: ? Put ice in a plastic bag. ? Place a towel between your skin and the bag. ? Leave the ice on for 20 minutes, 2-3 times per day.  Keep all follow-up visits as told by your health care provider. This is important.  Do any exercises as told by your health care provider, physical therapist, or occupational therapist. Contact a health care provider if:  You have new symptoms.  Your pain is not controlled with medicines.  Your symptoms get worse. This information is not intended to replace advice given to you by your health care provider. Make sure you discuss any questions you have with your health care provider. Document Released: 04/05/2000 Document  Revised: 08/17/2015 Document Reviewed: 08/24/2014 Elsevier Interactive Patient Education  [employer].

## 2017-05-09 NOTE — Progress Notes (Signed)
[facility]    Provider:  Dr Jaynee Eagles Referring Provider: Glendale Chard, MD Primary Care Physician:  Glendale Chard, MD  CC:  headache  HPI:  Rose Scott is a 52 y.o. female here as a referral from Dr. Baird Cancer for headache.  Past medical history of diabetes.  Symptoms started on 12/19, no inciting events or trigger, no illnesses.The right side of the head started feeling tingling and stabbing on the right sie of the head like sharp pain and needles(points to the parieto-ooccipital area). But she was bitten by something, she has swelling on her neck and on the leg around the same time. She has numbness in tingling in the hands but that has been ongoing for a while, Right-sided symptoms started with the headache. She says the right side of her body aches. It is "off and on", feels better with alleve, worse In the morning with numbnes sin the hands. She feels like she drags her right leg. She has noticed blurry vision. She has a remote history of migraines, she did not experience light or sound sensitivity with the most recent headache. No back pain or neck pain. She also has had episodes of loss of consciousness, with sweating, feeling dizzy, then graying out of vision and complete LOC. Unknown seizure-like activity.Multiple times. No other focal neurologic deficits, associated symptoms, inciting events or modifiable factors.  Reviewed notes, labs and imaging from outside physicians, which showed:  CMP with BUN 20 and creatinine 0.96  Patient was seen in the emergency room in December 2018 for strokelike symptoms.  The symptoms were in the setting of a recurrent headache, sharp shooting pain involving the right side of the head lasting for seconds and sporadically through the day.  Mild headache, no reported vision changes, confusion.  Also intermittent tingling sensation to the fingertips of her right hand for the past week, also throbbing pain to her right arm and right leg  for the same duration.  She felt her right leg was more swollen.  No history of alcohol or tobacco abuse.  Exam showed tenderness of the right upper extremity throughout the entire arm without focal point tenderness, also right lower extremity mild tenderness throughout the entire extremity, no weakness.  Ct showed No acute intracranial abnormalities including mass lesion or mass effect, hydrocephalus, extra-axial fluid collection, midline shift, hemorrhage, or acute infarction, large ischemic events (personally reviewed images)  Review of Systems: Patient complains of symptoms per HPI as well as the following symptoms: Fatigue, anemia, easy bruising, feeling hot, headache, numbness, joint pain, aching muscles. Pertinent negatives and positives per HPI. All others negative.   Social History   Socioeconomic History  . Marital status: Married    Spouse name: Not on file  . Number of children: 2  . Years of education: Not on file  . Highest education level: Bachelor's degree (e.g., BA, AB, BS)  Social Needs  . Financial resource strain: Not on file  . Food insecurity - worry: Not on file  . Food insecurity - inability: Not on file  . Transportation needs - medical: Not on file  . Transportation needs - non-medical: Not on file  Occupational History  . Not on file  Tobacco Use  . Smoking status: Never Smoker  . Smokeless tobacco: Never Used  Substance and Sexual Activity  . Alcohol use: Yes    Comment: rare occ 1/2 glass of red wine  . Drug use: No  . Sexual activity: Yes    Partners: Male  Birth control/protection: None, Surgical  Other Topics Concern  . Not on file  Social History Narrative   Lives at home with her spouse   Right handed   Drinks 2+ cups of caffeine daily    Family History  Problem Relation Age of Onset  . Diabetes Mother   . Breast cancer Mother   . COPD Father   . Congestive Heart Failure Father   . Diabetes Maternal Grandmother   . Hypertension  Maternal Grandmother   . Diabetes Maternal Grandfather   . Diabetes Paternal Grandmother   . Seizures Neg Hx     Past Medical History:  Diagnosis Date  . Anemia   . Pre-diabetes     Past Surgical History:  Procedure Laterality Date  . ABLATION  2011   endometrial  . TUBAL LIGATION  1995    No current outpatient medications on file.   No current facility-administered medications for this visit.     Allergies as of 05/09/2017 - Review Complete 05/09/2017  Allergen Reaction Noted  . Tape Rash 05/09/2017    Vitals: BP 131/73 (BP Location: Left Arm, Patient Position: Sitting)   Pulse 68   Ht 5\' 4"  (1.626 m)   Wt 201 lb (91.2 kg)   BMI 34.50 kg/m  Last Weight:  Wt Readings from Last 1 Encounters:  05/09/17 201 lb (91.2 kg)   Last Height:   Ht Readings from Last 1 Encounters:  05/09/17 5\' 4"  (1.626 m)    Physical exam: Exam: Gen: NAD, conversant, well nourised, well groomed                     CV: RRR, no MRG. No Carotid Bruits. No peripheral edema, warm, nontender Eyes: Conjunctivae clear without exudates or hemorrhage  Neuro: Detailed Neurologic Exam  Speech:    Speech is normal; fluent and spontaneous with normal comprehension.  Cognition:    The patient is oriented to person, place, and time;     recent and remote memory intact;     language fluent;     normal attention, concentration,     fund of knowledge Cranial Nerves:    The pupils are equal, round, and reactive to light. The fundi are normal and spontaneous venous pulsations are present. Visual fields are full to finger confrontation. Extraocular movements are intact. Trigeminal sensation is intact and the muscles of mastication are normal. The face is symmetric. The palate elevates in the midline. Hearing intact. Voice is normal. Shoulder shrug is normal. The tongue has normal motion without fasciculations.   Coordination:    Normal finger to nose and heel to shin. Normal rapid alternating  movements.   Gait:    Heel-toe and tandem gait are normal.   Motor Observation:    No asymmetry, no atrophy, and no involuntary movements noted. Tone:    Normal muscle tone.    Posture:    Posture is normal. normal erect    Strength:    Strength is V/V in the upper and lower limbs.      Sensation: intact to LT     Reflex Exam:  DTR's:    Deep tendon reflexes in the upper and lower extremities are normal bilaterally.   Toes:    The toes are downgoing bilaterally.   Clonus:    Clonus is absent.    Assessment/Plan:    MRI of the brain for new onset headache, right-sided paresthesias and weakness, alteration of awareness to eval for lesions or seizure  focus, strokes Emg/ncs of the bilateral upper extremities may be CTS Thigh neuralgia: Likely Meralgia Paresthetica:  LOC: sounds like vasovagal but will MRI brain to eval for seizure focus, she declines an eeg  Orders Placed This Encounter  Procedures  . MR BRAIN W WO CONTRAST  . NCV with EMG(electromyography)    Cc: Glendale Chard, MD  Discussed: To prevent or relieve headaches, try the following: Cool Compress. Lie down and place a cool compress on your head.  Avoid headache triggers. If certain foods or odors seem to have triggered your migraines in the past, avoid them. A headache diary might help you identify triggers.  Include physical activity in your daily routine. Try a daily walk or other moderate aerobic exercise.  Manage stress. Find healthy ways to cope with the stressors, such as delegating tasks on your to-do list.  Practice relaxation techniques. Try deep breathing, yoga, massage and visualization.  Eat regularly. Eating regularly scheduled meals and maintaining a healthy diet might help prevent headaches. Also, drink plenty of fluids.  Follow a regular sleep schedule. Sleep deprivation might contribute to headaches Consider biofeedback. With this mind-body technique, you learn to control certain bodily  functions - such as muscle tension, heart rate and blood pressure - to prevent headaches or reduce headache pain.    Proceed to emergency room if you experience new or worsening symptoms or symptoms do not resolve, if you have new neurologic symptoms or if headache is severe, or for any concerning symptom.   Provided education and documentation from American headache Society toolbox including articles on: chronic migraine medication overuse headache, chronic migraines, prevention of migraines, behavioral and other nonpharmacologic treatments for headache.  Also discussed: Patient is unable to drive, operate heavy machinery, perform activities at heights or participate in water activities until 6 months seizure free   Sarina Ill, MD  [facility] [address] [place], [place] [address]  Phone [phone] Fax [phone]

## 2017-05-11 ENCOUNTER — Encounter: Payer: Self-pay | Admitting: Neurology

## 2017-05-21 ENCOUNTER — Ambulatory Visit

## 2017-05-25 ENCOUNTER — Ambulatory Visit
Admission: RE | Admit: 2017-05-25 | Discharge: 2017-05-25 | Disposition: A | Discharge status: Home / Self Care | Source: Ambulatory Visit | Attending: Neurology | Admitting: Neurology

## 2017-05-25 DIAGNOSIS — R29898 Other symptoms and signs involving the musculoskeletal system: Secondary | ICD-10-CM | POA: Diagnosis not present

## 2017-05-25 DIAGNOSIS — R404 Transient alteration of awareness: Secondary | ICD-10-CM

## 2017-05-25 DIAGNOSIS — R202 Paresthesia of skin: Secondary | ICD-10-CM

## 2017-05-25 MED ORDER — GADOBENATE DIMEGLUMINE 529 MG/ML IV SOLN
19.0000 mL | Freq: Once | INTRAVENOUS | Status: AC | PRN
  Administered 2017-05-25: 19 mL via INTRAVENOUS

## 2017-05-29 ENCOUNTER — Telehealth: Payer: Self-pay | Admitting: *Deleted

## 2017-05-29 ENCOUNTER — Encounter

## 2017-05-29 ENCOUNTER — Encounter: Admitting: Neurology

## 2017-05-29 NOTE — Telephone Encounter (Addendum)
Called patient and LVM asking for call back. When she calls back, please let her know that her MRI of her brain is unremarkable for her age.    ----- Message from Melvenia Beam, MD sent at 05/29/2017  9:54 AM EST ----- MRI brain unremarkable for age thanks

## 2017-05-30 NOTE — Telephone Encounter (Signed)
Pt returned RN's call. Msg relayed, pt was appreciative, she did not have any questions.

## 2017-06-10 ENCOUNTER — Ambulatory Visit
Admission: RE | Admit: 2017-06-10 | Discharge: 2017-06-10 | Disposition: A | Discharge status: Home / Self Care | Source: Ambulatory Visit | Attending: Obstetrics | Admitting: Obstetrics

## 2017-06-10 DIAGNOSIS — Z139 Encounter for screening, unspecified: Secondary | ICD-10-CM

## 2017-06-12 ENCOUNTER — Encounter: Admitting: Neurology

## 2017-06-12 ENCOUNTER — Encounter

## 2017-06-26 ENCOUNTER — Ambulatory Visit (INDEPENDENT_AMBULATORY_CARE_PROVIDER_SITE_OTHER): Admitting: Neurology

## 2017-06-26 ENCOUNTER — Encounter (INDEPENDENT_AMBULATORY_CARE_PROVIDER_SITE_OTHER): Payer: Self-pay

## 2017-06-26 DIAGNOSIS — G5603 Carpal tunnel syndrome, bilateral upper limbs: Secondary | ICD-10-CM

## 2017-06-26 DIAGNOSIS — Z0289 Encounter for other administrative examinations: Secondary | ICD-10-CM

## 2017-06-26 NOTE — Patient Instructions (Signed)
Carpal Tunnel Syndrome Carpal tunnel syndrome is a condition that causes pain in your hand and arm. The carpal tunnel is a narrow area located on the palm side of your wrist. Repeated wrist motion or certain diseases may cause swelling within the tunnel. This swelling pinches the main nerve in the wrist (median nerve). What are the causes? This condition may be caused by:  Repeated wrist motions.  Wrist injuries.  Arthritis.  A cyst or tumor in the carpal tunnel.  Fluid buildup during pregnancy.  Sometimes the cause of this condition is not known. What increases the risk? This condition is more likely to develop in:  People who have jobs that cause them to repeatedly move their wrists in the same motion, such as butchers and cashiers.  Women.  People with certain conditions, such as: ? Diabetes. ? Obesity. ? An underactive thyroid (hypothyroidism). ? Kidney failure.  What are the signs or symptoms? Symptoms of this condition include:  A tingling feeling in your fingers, especially in your thumb, index, and middle fingers.  Tingling or numbness in your hand.  An aching feeling in your entire arm, especially when your wrist and elbow are bent for long periods of time.  Wrist pain that goes up your arm to your shoulder.  Pain that goes down into your palm or fingers.  A weak feeling in your hands. You may have trouble grabbing and holding items.  Your symptoms may feel worse during the night. How is this diagnosed? This condition is diagnosed with a medical history and physical exam. You may also have tests, including:  An electromyogram (EMG). This test measures electrical signals sent by your nerves into the muscles.  X-rays.  How is this treated? Treatment for this condition includes:  Lifestyle changes. It is important to stop doing or modify the activity that caused your condition.  Physical or occupational therapy.  Medicines for pain and inflammation.  This may include medicine that is injected into your wrist.  A wrist splint.  Surgery.  Follow these instructions at home: If you have a splint:  Wear it as told by your health care provider. Remove it only as told by your health care provider.  Loosen the splint if your fingers become numb and tingle, or if they turn cold and blue.  Keep the splint clean and dry. General instructions  Take over-the-counter and prescription medicines only as told by your health care provider.  Rest your wrist from any activity that may be causing your pain. If your condition is work related, talk to your employer about changes that can be made, such as getting a wrist pad to use while typing.  If directed, apply ice to the painful area: ? Put ice in a plastic bag. ? Place a towel between your skin and the bag. ? Leave the ice on for 20 minutes, 2-3 times per day.  Keep all follow-up visits as told by your health care provider. This is important.  Do any exercises as told by your health care provider, physical therapist, or occupational therapist. Contact a health care provider if:  You have new symptoms.  Your pain is not controlled with medicines.  Your symptoms get worse. This information is not intended to replace advice given to you by your health care provider. Make sure you discuss any questions you have with your health care provider. Document Released: 04/05/2000 Document Revised: 08/17/2015 Document Reviewed: 08/24/2014 Elsevier Interactive Patient Education  2018 Elsevier Inc.  

## 2017-06-26 NOTE — Progress Notes (Signed)
Discussed Carpan Tunnel Syndrome with patient.  Discussed pathophysiology, etiologies, therapeutic treatments including conservative measures, surgery or injections.  NCS of the LE were normal. A total of 25 minutes was spent in with this patient face-to-face. Over half this time was spent on counseling patient on the bilateral carpal tunnel diagnosis and different therapeutic options available.  This time does not include any time spent performing the EMG nerve conduction study.  Also reviewed images of the brain and answered all questions: recommend tight control of vascular risk factors given white matter changes which are nonspecific and not concerning for age.   IMPRESSION:  This MRI of the brain with and without contrast shows the following: 1.   Some scattered T2/FLAIR hyperintense foci in the white matter. This is nonspecific and most likely represents chronic microvascular ischemic change. Demyelination or vasculitis would be less likely. 2.   There are no acute findings and there is a normal enhancement pattern.  Reviewed the following information:   Carpal Tunnel Syndrome Carpal tunnel syndrome is a condition that causes pain in your hand and arm. The carpal tunnel is a narrow area located on the palm side of your wrist. Repeated wrist motion or certain diseases may cause swelling within the tunnel. This swelling pinches the main nerve in the wrist (median nerve). What are the causes? This condition may be caused by:  Repeated wrist motions.  Wrist injuries.  Arthritis.  A cyst or tumor in the carpal tunnel.  Fluid buildup during pregnancy.  Sometimes the cause of this condition is not known. What increases the risk? This condition is more likely to develop in:  People who have jobs that cause them to repeatedly move their wrists in the same motion, such as Art gallery manager.  Women.  People with certain conditions, such as: ? Diabetes. ? Obesity. ? An underactive  thyroid (hypothyroidism). ? Kidney failure.  What are the signs or symptoms? Symptoms of this condition include:  A tingling feeling in your fingers, especially in your thumb, index, and middle fingers.  Tingling or numbness in your hand.  An aching feeling in your entire arm, especially when your wrist and elbow are bent for long periods of time.  Wrist pain that goes up your arm to your shoulder.  Pain that goes down into your palm or fingers.  A weak feeling in your hands. You may have trouble grabbing and holding items.  Your symptoms may feel worse during the night. How is this diagnosed? This condition is diagnosed with a medical history and physical exam. You may also have tests, including:  An electromyogram (EMG). This test measures electrical signals sent by your nerves into the muscles.  X-rays.  How is this treated? Treatment for this condition includes:  Lifestyle changes. It is important to stop doing or modify the activity that caused your condition.  Physical or occupational therapy.  Medicines for pain and inflammation. This may include medicine that is injected into your wrist.  A wrist splint.  Surgery.  Follow these instructions at home: If you have a splint:  Wear it as told by your health care provider. Remove it only as told by your health care provider.  Loosen the splint if your fingers become numb and tingle, or if they turn cold and blue.  Keep the splint clean and dry. General instructions  Take over-the-counter and prescription medicines only as told by your health care provider.  Rest your wrist from any activity that may be causing  your pain. If your condition is work related, talk to your employer about changes that can be made, such as getting a wrist pad to use while typing.  If directed, apply ice to the painful area: ? Put ice in a plastic bag. ? Place a towel between your skin and the bag. ? Leave the ice on for 20 minutes,  2-3 times per day.  Keep all follow-up visits as told by your health care provider. This is important.  Do any exercises as told by your health care provider, physical therapist, or occupational therapist. Contact a health care provider if:  You have new symptoms.  Your pain is not controlled with medicines.  Your symptoms get worse. This information is not intended to replace advice given to you by your health care provider. Make sure you discuss any questions you have with your health care provider. Document Released: 04/05/2000 Document Revised: 08/17/2015 Document Reviewed: 08/24/2014 Elsevier Interactive Patient Education  [employer].

## 2017-06-26 NOTE — Progress Notes (Signed)
See procedure note.

## 2017-06-26 NOTE — Progress Notes (Addendum)
Full Name: Rose Scott Gender: Female MRN #: [id number] Date of Birth: July 31, 2065    Visit Date: 06/26/17 08:22 Age: 52 Years 9 Months Old Examining Physician: Sarina Ill, MD  Referring Physician: Jaynee Eagles, MD   History: Evaluation for bilateral Carpal Tunnel Syndrome.   Summary:   Nerve Conduction Studies were performed on the bilateral upper extremities and the right lower extremity.. The right Median orthodromic 2nd Digit sensory nerve showed prolonged distal peak latency (3.6 ms, N<3. 4) and decreased amplitude (5uV, N>10) The left Median orthodromic 2nd Digit sensory nerve showed borderline distal peak latency (3.4 ms, N<3.4) and decreased amplitude (8uV, N>10)  The right median/ulnar (palm) comparison nerve showed prolonged distal peak latency (Median Palm, 2.7 ms, N<2.2) and abnormal peak latency difference (Median Palm-Ulnar Palm, 0.6 ms, N<0.4) with a relative median delay.   The left median/ulnar (palm) comparison nerve showed prolonged distal peak latency (Median Palm, 2.4 ms, N<2.2) and borderline peak latency difference (Median Palm-Ulnar Palm, 0.4 ms, N<0.4) with a relative median delay.   All remaining nerves and muscles were within normal limits (as detailed in the table as below).  Conclusion: There is right greater than left mild carpal tunnel syndrome. NCS of the right lower extremity were normal no suggestion of polyneuropathy.   Sarina Ill M.D.  [facility] [place],  [address] Tel: [phone] Fax: [phone]        [facility]    Nerve / Sites Muscle Latency Ref. Amplitude Ref. Rel Amp Segments Distance Velocity Ref. Area    ms ms mV mV %  cm m/s m/s mVms  R Median - APB     Wrist APB 4.0 ?4.4 7.0 ?4.0 100 Wrist - APB 7   21.8     Upper arm APB 8.0  6.6  94.2 Upper arm - Wrist 21 52 ?49 21.0  L Median - APB     Wrist APB 3.8 ?4.4 6.8 ?4.0 100 Wrist - APB 7   18.4     Upper arm APB 7.7  6.6  97.6 Upper arm  - Wrist 21 53 ?49 18.5  R Ulnar - ADM     Wrist ADM 2.1 ?3.3 8.5 ?6.0 100 Wrist - ADM 7   18.6     B.Elbow ADM 5.6  7.6  89.2 B.Elbow - Wrist 19 54 ?49 18.9     A.Elbow ADM 7.7  7.2  94.2 A.Elbow - B.Elbow 12 58 ?49 18.0         A.Elbow - Wrist      L Ulnar - ADM     Wrist ADM 2.7 ?3.3 7.8 ?6.0 100 Wrist - ADM 7   15.5     B.Elbow ADM 5.9  6.2  79.1 B.Elbow - Wrist 19 59 ?49 12.5     A.Elbow ADM 8.0  6.2  99.8 A.Elbow - B.Elbow 12 59 ?49 12.4         A.Elbow - Wrist      R Peroneal - EDB     Ankle EDB 4.0 ?6.5 3.2 ?2.0 100 Ankle - EDB 9   11.4     Fib head EDB 10.2  2.7  85 Fib head - Ankle 30 48 ?44 10.7     Pop fossa EDB 12.3  2.3  83.8 Pop fossa - Fib head 10 46 ?44 9.0         Pop fossa - Ankle      R Tibial -  AH     Ankle AH 3.6 ?5.8 8.8 ?4.0 100 Ankle - AH 9   23.8     Pop fossa AH 12.0  8.1  92.1 Pop fossa - Ankle 35 42 ?41 23.2                 SNC    Nerve / Sites Rec. Site Peak Lat Ref.  Amp Ref. Segments Distance Peak Diff Ref.    ms ms V V  cm ms ms  R Sural - Ankle (Calf)     Calf Ankle 3.7 ?4.4 12 ?6 Calf - Ankle 14    R Superficial peroneal - Ankle     Lat leg Ankle 4.0 ?4.4 5 ?6 Lat leg - Ankle 14    R Median, Ulnar - Transcarpal comparison     Median Palm Wrist 2.7 ?2.2 11 ?35 Median Palm - Wrist 8       Ulnar Palm Wrist 2.1 ?2.2 6 ?12 Ulnar Palm - Wrist 8          Median Palm - Ulnar Palm  0.6 ?0.4  L Median, Ulnar - Transcarpal comparison     Median Palm Wrist 2.4 ?2.2 18 ?35 Median Palm - Wrist 8       Ulnar Palm Wrist 2.0 ?2.2 11 ?12 Ulnar Palm - Wrist 8          Median Palm - Ulnar Palm  0.4 ?0.4  R Median - Orthodromic (Dig II, Mid palm)     Dig II Wrist 3.6 ?3.4 5 ?10 Dig II - Wrist 13    L Median - Orthodromic (Dig II, Mid palm)     Dig II Wrist 3.4 ?3.4 8 ?10 Dig II - Wrist 13    R Ulnar - Orthodromic, (Dig V, Mid palm)     Dig V Wrist 2.6 ?3.1 4 ?5 Dig V - Wrist 11    L Ulnar - Orthodromic, (Dig V, Mid palm)     Dig V Wrist 2.6 ?3.1 6 ?5 Dig V -  Wrist 61                       F  Wave    Nerve F Lat Ref.   ms ms  R Ulnar - ADM 27.4 ?32.0  R Tibial - AH 46.6 ?56.0  L Ulnar - ADM 27.1 ?32.0            EMG full       EMG Summary Table    Spontaneous MUAP Recruitment  Muscle IA Fib PSW Fasc Other Amp Dur. Poly Pattern  L. Deltoid Normal None None None _______ Normal Normal Normal Normal  R. Deltoid Normal None None None _______ Normal Normal Normal Normal  L. Triceps brachii Normal None None None _______ Normal Normal Normal Normal  R. Triceps brachii Normal None None None _______ Normal Normal Normal Normal  L. Pronator teres Normal None None None _______ Normal Normal Normal Normal  R. Pronator teres Normal None None None _______ Normal Normal Normal Normal  L. First dorsal interosseous Normal None None None _______ Normal Normal Normal Normal  R. First dorsal interosseous Normal None None None _______ Normal Normal Normal Normal  L. Opponens pollicis Normal None None None _______ Normal Normal Normal Normal  R. Opponens pollicis Normal None None None _______ Normal Normal Normal Normal

## 2017-06-30 NOTE — Procedures (Signed)
Full Name: Rose Scott Gender: Female MRN #: [id number] Date of Birth: 04-Apr-2066    Visit Date: 06/26/17 08:22 Age: 52 Years 76 Months Old Examining Physician: Sarina Ill, MD  Referring Physician: Jaynee Eagles, MD   History: Evaluation for bilateral Carpal Tunnel Syndrome.   Summary:   Nerve Conduction Studies were performed on the bilateral upper extremities and the right lower extremity.. The right Median orthodromic 2nd Digit sensory nerve showed prolonged distal peak latency (3.6 ms, N<3. 4) and decreased amplitude (5uV, N>10) The left Median orthodromic 2nd Digit sensory nerve showed borderline distal peak latency (3.4 ms, N<3.4) and decreased amplitude (8uV, N>10)  The right median/ulnar (palm) comparison nerve showed prolonged distal peak latency (Median Palm, 2.7 ms, N<2.2) and abnormal peak latency difference (Median Palm-Ulnar Palm, 0.6 ms, N<0.4) with a relative median delay.   The left median/ulnar (palm) comparison nerve showed prolonged distal peak latency (Median Palm, 2.4 ms, N<2.2) and borderline peak latency difference (Median Palm-Ulnar Palm, 0.4 ms, N<0.4) with a relative median delay.   All remaining nerves and muscles were within normal limits (as detailed in the table as below).  Conclusion: There is right greater than left mild carpal tunnel syndrome. NCS of the right lower extremity were normal no suggestion of polyneuropathy.   Sarina Ill M.D.  [facility] [place], [place] [address] Tel: [phone] Fax: [phone]        [facility]    Nerve / Sites Muscle Latency Ref. Amplitude Ref. Rel Amp Segments Distance Velocity Ref. Area    ms ms mV mV %  cm m/s m/s mVms  R Median - APB     Wrist APB 4.0 ?4.4 7.0 ?4.0 100 Wrist - APB 7   21.8     Upper arm APB 8.0  6.6  94.2 Upper arm - Wrist 21 52 ?49 21.0  L Median - APB     Wrist APB 3.8 ?4.4 6.8 ?4.0 100 Wrist - APB 7   18.4     Upper arm APB 7.7  6.6  97.6 Upper arm  - Wrist 21 53 ?49 18.5  R Ulnar - ADM     Wrist ADM 2.1 ?3.3 8.5 ?6.0 100 Wrist - ADM 7   18.6     B.Elbow ADM 5.6  7.6  89.2 B.Elbow - Wrist 19 54 ?49 18.9     A.Elbow ADM 7.7  7.2  94.2 A.Elbow - B.Elbow 12 58 ?49 18.0         A.Elbow - Wrist      L Ulnar - ADM     Wrist ADM 2.7 ?3.3 7.8 ?6.0 100 Wrist - ADM 7   15.5     B.Elbow ADM 5.9  6.2  79.1 B.Elbow - Wrist 19 59 ?49 12.5     A.Elbow ADM 8.0  6.2  99.8 A.Elbow - B.Elbow 12 59 ?49 12.4         A.Elbow - Wrist      R Peroneal - EDB     Ankle EDB 4.0 ?6.5 3.2 ?2.0 100 Ankle - EDB 9   11.4     Fib head EDB 10.2  2.7  85 Fib head - Ankle 30 48 ?44 10.7     Pop fossa EDB 12.3  2.3  83.8 Pop fossa - Fib head 10 46 ?44 9.0         Pop fossa - Ankle      R Tibial -  AH     Ankle AH 3.6 ?5.8 8.8 ?4.0 100 Ankle - AH 9   23.8     Pop fossa AH 12.0  8.1  92.1 Pop fossa - Ankle 35 42 ?41 23.2                 SNC    Nerve / Sites Rec. Site Peak Lat Ref.  Amp Ref. Segments Distance Peak Diff Ref.    ms ms V V  cm ms ms  R Sural - Ankle (Calf)     Calf Ankle 3.7 ?4.4 12 ?6 Calf - Ankle 14    R Superficial peroneal - Ankle     Lat leg Ankle 4.0 ?4.4 5 ?6 Lat leg - Ankle 14    R Median, Ulnar - Transcarpal comparison     Median Palm Wrist 2.7 ?2.2 11 ?35 Median Palm - Wrist 8       Ulnar Palm Wrist 2.1 ?2.2 6 ?12 Ulnar Palm - Wrist 8          Median Palm - Ulnar Palm  0.6 ?0.4  L Median, Ulnar - Transcarpal comparison     Median Palm Wrist 2.4 ?2.2 18 ?35 Median Palm - Wrist 8       Ulnar Palm Wrist 2.0 ?2.2 11 ?12 Ulnar Palm - Wrist 8          Median Palm - Ulnar Palm  0.4 ?0.4  R Median - Orthodromic (Dig II, Mid palm)     Dig II Wrist 3.6 ?3.4 5 ?10 Dig II - Wrist 13    L Median - Orthodromic (Dig II, Mid palm)     Dig II Wrist 3.4 ?3.4 8 ?10 Dig II - Wrist 13    R Ulnar - Orthodromic, (Dig V, Mid palm)     Dig V Wrist 2.6 ?3.1 4 ?5 Dig V - Wrist 11    L Ulnar - Orthodromic, (Dig V, Mid palm)     Dig V Wrist 2.6 ?3.1 6 ?5 Dig V -  Wrist 44                       F  Wave    Nerve F Lat Ref.   ms ms  R Ulnar - ADM 27.4 ?32.0  R Tibial - AH 46.6 ?56.0  L Ulnar - ADM 27.1 ?32.0            EMG full       EMG Summary Table    Spontaneous MUAP Recruitment  Muscle IA Fib PSW Fasc Other Amp Dur. Poly Pattern  L. Deltoid Normal None None None _______ Normal Normal Normal Normal  R. Deltoid Normal None None None _______ Normal Normal Normal Normal  L. Triceps brachii Normal None None None _______ Normal Normal Normal Normal  R. Triceps brachii Normal None None None _______ Normal Normal Normal Normal  L. Pronator teres Normal None None None _______ Normal Normal Normal Normal  R. Pronator teres Normal None None None _______ Normal Normal Normal Normal  L. First dorsal interosseous Normal None None None _______ Normal Normal Normal Normal  R. First dorsal interosseous Normal None None None _______ Normal Normal Normal Normal  L. Opponens pollicis Normal None None None _______ Normal Normal Normal Normal  R. Opponens pollicis Normal None None None _______ Normal Normal Normal Normal

## 2018-02-28 DIAGNOSIS — E119 Type 2 diabetes mellitus without complications: Secondary | ICD-10-CM

## 2018-02-28 HISTORY — DX: Type 2 diabetes mellitus without complications: E11.9

## 2018-06-09 ENCOUNTER — Other Ambulatory Visit: Payer: Self-pay | Admitting: Obstetrics

## 2018-06-09 DIAGNOSIS — Z1231 Encounter for screening mammogram for malignant neoplasm of breast: Secondary | ICD-10-CM

## 2018-07-03 ENCOUNTER — Ambulatory Visit (INDEPENDENT_AMBULATORY_CARE_PROVIDER_SITE_OTHER): Admitting: Obstetrics

## 2018-07-03 ENCOUNTER — Ambulatory Visit
Admission: RE | Admit: 2018-07-03 | Discharge: 2018-07-03 | Disposition: A | Discharge status: Home / Self Care | Source: Ambulatory Visit | Attending: Obstetrics | Admitting: Obstetrics

## 2018-07-03 ENCOUNTER — Other Ambulatory Visit: Payer: Self-pay

## 2018-07-03 ENCOUNTER — Encounter: Payer: Self-pay | Admitting: Obstetrics

## 2018-07-03 VITALS — BP 137/81 | HR 86 | Wt 204.4 lb

## 2018-07-03 DIAGNOSIS — N939 Abnormal uterine and vaginal bleeding, unspecified: Secondary | ICD-10-CM

## 2018-07-03 DIAGNOSIS — Z01419 Encounter for gynecological examination (general) (routine) without abnormal findings: Secondary | ICD-10-CM

## 2018-07-03 DIAGNOSIS — Z124 Encounter for screening for malignant neoplasm of cervix: Secondary | ICD-10-CM

## 2018-07-03 DIAGNOSIS — Z113 Encounter for screening for infections with a predominantly sexual mode of transmission: Secondary | ICD-10-CM

## 2018-07-03 DIAGNOSIS — Z1151 Encounter for screening for human papillomavirus (HPV): Secondary | ICD-10-CM | POA: Diagnosis not present

## 2018-07-03 DIAGNOSIS — R319 Hematuria, unspecified: Secondary | ICD-10-CM | POA: Diagnosis not present

## 2018-07-03 DIAGNOSIS — Z1231 Encounter for screening mammogram for malignant neoplasm of breast: Secondary | ICD-10-CM

## 2018-07-03 DIAGNOSIS — N898 Other specified noninflammatory disorders of vagina: Secondary | ICD-10-CM

## 2018-07-03 DIAGNOSIS — B373 Candidiasis of vulva and vagina: Secondary | ICD-10-CM

## 2018-07-03 LAB — POCT URINALYSIS DIPSTICK
Bilirubin, UA: NEGATIVE
Glucose, UA: NEGATIVE
KETONES UA: NEGATIVE
Leukocytes, UA: NEGATIVE
NITRITE UA: NEGATIVE
PH UA: 6 (ref 5.0–8.0)
PROTEIN UA: POSITIVE — AB
SPEC GRAV UA: 1.01 (ref 1.010–1.025)
UROBILINOGEN UA: 0.2 U/dL

## 2018-07-03 NOTE — Progress Notes (Signed)
Pt presents for annual, pap, and all STD blood work.  MGM completed this morning.  Colonoscopy UTD 2015 per pt

## 2018-07-03 NOTE — Progress Notes (Signed)
Subjective:        Rose Scott is a 53 y.o. female here for a routine exam.  Current complaints: Pelvic pain and period-like vaginal bleeding 10 years after an endometrial ablation.  Personal health questionnaire:  Is patient Ashkenazi Jewish, have a family history of breast and/or ovarian cancer: no Is there a family history of uterine cancer diagnosed at age < 63, gastrointestinal cancer, urinary tract cancer, family member who is a Field seismologist syndrome-associated carrier: no Is the patient overweight and hypertensive, family history of diabetes, personal history of gestational diabetes, preeclampsia or PCOS: no Is patient over 30, have PCOS,  family history of premature CHD under age 93, diabetes, smoke, have hypertension or peripheral artery disease:  no At any time, has a partner hit, kicked or otherwise hurt or frightened you?: no Over the past 2 weeks, have you felt down, depressed or hopeless?: no Over the past 2 weeks, have you felt little interest or pleasure in doing things?:no   Gynecologic History No LMP recorded. Patient has had an ablation. Contraception: tubal ligation Last Pap: 2018. Results were: normal Last mammogram: 2020. Results were: pending  Obstetric History OB History  Gravida Para Term Preterm AB Living  2 2 2     2   SAB TAB Ectopic Multiple Live Births          2    # Outcome Date GA Lbr Len/2nd Weight Sex Delivery Anes PTL Lv  2 Term           1 Term             Past Medical History:  Diagnosis Date  . Anemia   . Pre-diabetes     Past Surgical History:  Procedure Laterality Date  . ABLATION  2011   endometrial  . TUBAL LIGATION  1995     Current Outpatient Medications:  .  albuterol (PROVENTIL HFA;VENTOLIN HFA) 108 (90 Base) MCG/ACT inhaler, Inhale into the lungs., Disp: , Rfl:  .  loratadine (CLARITIN) 10 MG tablet, Take by mouth., Disp: , Rfl:  .  losartan-hydrochlorothiazide (HYZAAR) 50-12.5 MG tablet, Take by mouth., Disp: , Rfl:   .  metFORMIN (GLUCOPHAGE-XR) 500 MG 24 hr tablet, Take by mouth., Disp: , Rfl:  Allergies  Allergen Reactions  . Multihance [Gadobenate] Nausea And Vomiting, Other (See Comments) and Cough    Pt became nauseous and started vomiting a couple of minutes after 19 mL Multihance given. Pt stated that her throat felt tight and like fluid was stuck in her throat. Pt's eyes were red. Dr. Joneen Caraway assessed pt and gave pt benedryl. Pt began to feel better after vomiting and benedryl given.   . Tape Rash    Needs paper tape    Social History   Tobacco Use  . Smoking status: Never Smoker  . Smokeless tobacco: Never Used  Substance Use Topics  . Alcohol use: Yes    Comment: rare occ 1/2 glass of red wine    Family History  Problem Relation Age of Onset  . Diabetes Mother   . Breast cancer Mother        in 63's  . COPD Father   . Congestive Heart Failure Father   . Diabetes Maternal Grandmother   . Hypertension Maternal Grandmother   . Diabetes Maternal Grandfather   . Diabetes Paternal Grandmother   . Breast cancer Maternal Aunt   . Seizures Neg Hx       Review of Systems  Constitutional: negative for  fatigue and weight loss Respiratory: negative for cough and wheezing Cardiovascular: negative for chest pain, fatigue and palpitations Gastrointestinal: negative for abdominal pain and change in bowel habits Musculoskeletal:negative for myalgias Neurological: negative for gait problems and tremors Behavioral/Psych: negative for abusive relationship, depression Endocrine: negative for temperature intolerance    Genitourinary:positive for abnormal menstrual periods Integument/breast: negative for breast lump, breast tenderness, nipple discharge and skin lesion(s)    Objective:       BP 137/81   Pulse 86   Wt 204 lb 6.4 oz (92.7 kg)   BMI 35.09 kg/m  General:   alert  Skin:   no rash or abnormalities  Lungs:   clear to auscultation bilaterally  Heart:   regular rate and rhythm,  S1, S2 normal, no murmur, click, rub or gallop  Breasts:   normal without suspicious masses, skin or nipple changes or axillary nodes  Abdomen:  normal findings: no organomegaly, soft, non-tender and no hernia  Pelvis:  External genitalia: normal general appearance Urinary system: urethral meatus normal and bladder without fullness, nontender Vaginal: normal without tenderness, induration or masses Cervix: normal appearance Adnexa: tenderness in left adnexa.  No masses Uterus: anteverted and non-tender, normal size   Lab Review Urine pregnancy test Labs reviewed yes Radiologic studies reviewed yes  50% of 20 min visit spent on counseling and coordination of care.   Assessment:     1. Encounter for routine gynecological examination with Papanicolaou smear of cervix Rx: - Cytology - PAP( [facility])  2. Abnormal uterine bleeding (AUB) Rx: - US PELVIC COMPLETE WITH TRANSVAGINAL; Future  3. Vaginal discharge Rx: - Cervicovaginal ancillary only( [facility])  4. Hematuria of unknown etiology Rx: - POCT Urinalysis Dipstick - urine culture  5. Screening for STD (sexually transmitted disease) Rx: - Hepatitis B surface antigen - Hepatitis C antibody - HIV Antibody (routine testing w rflx) - RPR    Plan:    Education reviewed: calcium supplements, depression evaluation, low fat, low cholesterol diet, safe sex/STD prevention, self breast exams and weight bearing exercise. Follow up in: 2 weeks.   No orders of the defined types were placed in this encounter.  Orders Placed This Encounter  Procedures  . US PELVIC COMPLETE WITH TRANSVAGINAL    Epic order Wt.204/ no needs Ins- [employer] prime No fever, cough, sob/ no travel in last 14 days Pda/ [facility]ria @ [facility] [id number] Pt aware to drink 32 oz of water- 1 hour prior to appt - and do not void     Standing Status:   Future    Standing Expiration Date:   09/02/2019    Order Specific Question:   Reason for Exam (SYMPTOM  OR  DIAGNOSIS REQUIRED)    Answer:   AUB.  S/P Endometrial Ablation ~ 10 years ago.    Order Specific Question:   Preferred imaging location?    Answer:   [facility]  . Hepatitis B surface antigen  . Hepatitis C antibody  . HIV Antibody (routine testing w rflx)  . RPR  . POCT Urinalysis Dipstick    Shelly Bombard MD 07-03-2018

## 2018-07-04 LAB — HIV ANTIBODY (ROUTINE TESTING W REFLEX): HIV SCREEN 4TH GENERATION: NONREACTIVE

## 2018-07-04 LAB — HEPATITIS B SURFACE ANTIGEN: HEP B S AG: NEGATIVE

## 2018-07-04 LAB — RPR: RPR: NONREACTIVE

## 2018-07-04 LAB — HEPATITIS C ANTIBODY

## 2018-07-05 LAB — URINE CULTURE: ORGANISM ID, BACTERIA: NO GROWTH

## 2018-07-06 LAB — CERVICOVAGINAL ANCILLARY ONLY
BACTERIAL VAGINITIS: NEGATIVE
CANDIDA VAGINITIS: POSITIVE — AB
Chlamydia: NEGATIVE
Neisseria Gonorrhea: NEGATIVE
Trichomonas: NEGATIVE

## 2018-07-07 ENCOUNTER — Other Ambulatory Visit: Payer: Self-pay | Admitting: Obstetrics

## 2018-07-07 DIAGNOSIS — B3731 Acute candidiasis of vulva and vagina: Secondary | ICD-10-CM

## 2018-07-07 DIAGNOSIS — B373 Candidiasis of vulva and vagina: Secondary | ICD-10-CM

## 2018-07-07 LAB — CYTOLOGY - PAP
Adequacy: ABSENT
Diagnosis: NEGATIVE
HPV: NOT DETECTED

## 2018-07-07 MED ORDER — FLUCONAZOLE 150 MG PO TABS: 150.0000 mg | ORAL_TABLET | Freq: Once | ORAL | 0 refills | Status: AC

## 2018-07-08 ENCOUNTER — Other Ambulatory Visit: Payer: Self-pay | Admitting: Obstetrics

## 2018-07-08 ENCOUNTER — Ambulatory Visit
Admission: RE | Admit: 2018-07-08 | Discharge: 2018-07-08 | Disposition: A | Discharge status: Home / Self Care | Source: Ambulatory Visit | Attending: Obstetrics | Admitting: Obstetrics

## 2018-07-08 ENCOUNTER — Other Ambulatory Visit: Payer: Self-pay

## 2018-07-08 DIAGNOSIS — N83201 Unspecified ovarian cyst, right side: Secondary | ICD-10-CM

## 2018-07-08 DIAGNOSIS — N939 Abnormal uterine and vaginal bleeding, unspecified: Secondary | ICD-10-CM

## 2018-07-08 DIAGNOSIS — B3731 Acute candidiasis of vulva and vagina: Secondary | ICD-10-CM

## 2018-07-08 DIAGNOSIS — B373 Candidiasis of vulva and vagina: Secondary | ICD-10-CM

## 2018-07-08 DIAGNOSIS — N83202 Unspecified ovarian cyst, left side: Principal | ICD-10-CM

## 2018-07-08 MED ORDER — FLUCONAZOLE 150 MG PO TABS: 150.0000 mg | ORAL_TABLET | Freq: Once | ORAL | 0 refills | Status: AC

## 2018-07-09 ENCOUNTER — Ambulatory Visit (INDEPENDENT_AMBULATORY_CARE_PROVIDER_SITE_OTHER): Admitting: Obstetrics

## 2018-07-09 ENCOUNTER — Encounter: Payer: Self-pay | Admitting: Obstetrics

## 2018-07-09 ENCOUNTER — Telehealth: Payer: Self-pay

## 2018-07-09 ENCOUNTER — Other Ambulatory Visit (HOSPITAL_COMMUNITY)
Admission: RE | Admit: 2018-07-09 | Discharge: 2018-07-09 | Disposition: A | Discharge status: Home / Self Care | Source: Ambulatory Visit | Attending: Obstetrics | Admitting: Obstetrics

## 2018-07-09 VITALS — BP 144/81 | HR 70 | Ht 67.0 in | Wt 208.0 lb

## 2018-07-09 DIAGNOSIS — N939 Abnormal uterine and vaginal bleeding, unspecified: Secondary | ICD-10-CM | POA: Insufficient documentation

## 2018-07-09 DIAGNOSIS — D219 Benign neoplasm of connective and other soft tissue, unspecified: Secondary | ICD-10-CM

## 2018-07-09 DIAGNOSIS — Z9889 Other specified postprocedural states: Secondary | ICD-10-CM

## 2018-07-09 DIAGNOSIS — N950 Postmenopausal bleeding: Secondary | ICD-10-CM

## 2018-07-09 DIAGNOSIS — N83201 Unspecified ovarian cyst, right side: Secondary | ICD-10-CM

## 2018-07-09 DIAGNOSIS — N83202 Unspecified ovarian cyst, left side: Secondary | ICD-10-CM

## 2018-07-09 NOTE — Telephone Encounter (Signed)
S/w pt and advised of results and rx sent. 

## 2018-07-09 NOTE — Progress Notes (Signed)
RGYN pt presents for Endometrial Bx and CA125

## 2018-07-09 NOTE — Progress Notes (Signed)
Endometrial Biopsy Procedure Note  Pre-operative Diagnosis: Postmenopausal Bleeding  Post-operative Diagnosis: same  Indications: Postmenopausal Bleeding  Procedure Details   Urine pregnancy test was not done.  The risks (including infection, bleeding, pain, and uterine perforation) and benefits of the procedure were explained to the patient and Written informed consent was obtained.    The patient was placed in the dorsal lithotomy position.  Bimanual exam showed the uterus to be in the neutral position.  A Graves' speculum inserted in the vagina, and the cervix prepped with povidone iodine.  Endocervical curettage with a Kevorkian curette was not performed.   A sharp tenaculum was applied to the anterior lip of the cervix for stabilization.  A sterile uterine sound was used to sound the uterus to a depth of 7cm.  A Pipelle endometrial aspirator was used to sample the endometrium.  Sample was sent for pathologic examination.  Condition: Stable  Complications: None  Plan:  The patient was advised to call for any fever or for prolonged or severe pain or bleeding. She was advised to use NSAID as needed for mild to moderate pain. She was advised to avoid vaginal intercourse for 48 hours or until the bleeding has completely stopped.  Attending Physician Documentation: I was present for or participated in the entire procedure, including opening and closing.   Shelly Bombard MD 07-09-2018

## 2018-07-10 LAB — CA 125: Cancer Antigen (CA) 125: 5.1 U/mL (ref 0.0–38.1)

## 2018-07-17 ENCOUNTER — Ambulatory Visit (INDEPENDENT_AMBULATORY_CARE_PROVIDER_SITE_OTHER): Admitting: Obstetrics

## 2018-07-17 ENCOUNTER — Encounter: Payer: Self-pay | Admitting: Obstetrics

## 2018-07-17 ENCOUNTER — Other Ambulatory Visit: Payer: Self-pay

## 2018-07-17 VITALS — BP 152/77 | HR 77 | Wt 207.0 lb

## 2018-07-17 DIAGNOSIS — N950 Postmenopausal bleeding: Secondary | ICD-10-CM

## 2018-07-17 DIAGNOSIS — R9389 Abnormal findings on diagnostic imaging of other specified body structures: Secondary | ICD-10-CM

## 2018-07-17 DIAGNOSIS — Z9289 Personal history of other medical treatment: Secondary | ICD-10-CM

## 2018-07-17 DIAGNOSIS — D219 Benign neoplasm of connective and other soft tissue, unspecified: Secondary | ICD-10-CM | POA: Diagnosis not present

## 2018-07-17 DIAGNOSIS — N83202 Unspecified ovarian cyst, left side: Secondary | ICD-10-CM

## 2018-07-17 DIAGNOSIS — B369 Superficial mycosis, unspecified: Secondary | ICD-10-CM

## 2018-07-17 DIAGNOSIS — Z9889 Other specified postprocedural states: Secondary | ICD-10-CM

## 2018-07-17 MED ORDER — CLOTRIMAZOLE 1 % EX CREA: 1.0000 "application " | TOPICAL_CREAM | Freq: Two times a day (BID) | CUTANEOUS | 2 refills | Status: DC

## 2018-07-17 NOTE — Progress Notes (Signed)
Patient ID: Rose Scott, female   DOB: [birth year], 53 y.o.   MRN: [id number]  Chief Complaint  Patient presents with  . Follow-up    results    HPI Rose Scott is a 53 y.o. female.  History of postmenopausal bleeding.  S/P NovaSure Endometrial Ablation in 2012.  Ultrasound revealed a 6.8 cm complex, septated left ovarian cyst.  CA 125 is 5.  Endometrial biopsy done.  Patient presents today for results and management recommendations. HPI  Past Medical History:  Diagnosis Date  . Anemia   . Pre-diabetes     Past Surgical History:  Procedure Laterality Date  . ABLATION  2011   endometrial  . TUBAL LIGATION  1995    Family History  Problem Relation Age of Onset  . Diabetes Mother   . Breast cancer Mother        in 79's  . COPD Father   . Congestive Heart Failure Father   . Diabetes Maternal Grandmother   . Hypertension Maternal Grandmother   . Diabetes Maternal Grandfather   . Diabetes Paternal Grandmother   . Breast cancer Maternal Aunt   . Seizures Neg Hx     Social History Social History   Tobacco Use  . Smoking status: Never Smoker  . Smokeless tobacco: Never Used  Substance Use Topics  . Alcohol use: Yes    Comment: rare occ 1/2 glass of red wine  . Drug use: No    Allergies  Allergen Reactions  . Multihance [Gadobenate] Nausea And Vomiting, Other (See Comments) and Cough    Pt became nauseous and started vomiting a couple of minutes after 19 mL Multihance given. Pt stated that her throat felt tight and like fluid was stuck in her throat. Pt's eyes were red. Dr. Joneen Caraway assessed pt and gave pt benedryl. Pt began to feel better after vomiting and benedryl given.   . Tape Rash    Needs paper tape    Current Outpatient Medications  Medication Sig Dispense Refill  . albuterol (PROVENTIL HFA;VENTOLIN HFA) 108 (90 Base) MCG/ACT inhaler Inhale into the lungs.    . clotrimazole (LOTRIMIN) 1 % cream Apply 1 application topically 2 (two) times daily. 60 g 2   . loratadine (CLARITIN) 10 MG tablet Take by mouth.    . losartan-hydrochlorothiazide (HYZAAR) 50-12.5 MG tablet Take by mouth.    . metFORMIN (GLUCOPHAGE-XR) 500 MG 24 hr tablet Take by mouth.     No current facility-administered medications for this visit.     Review of Systems Review of Systems Constitutional: negative for fatigue and weight loss Respiratory: negative for cough and wheezing Cardiovascular: negative for chest pain, fatigue and palpitations Gastrointestinal: negative for abdominal pain and change in bowel habits Genitourinary:positive for vaginal spotting Integument/breast: positive for rash and irritation in crease under breasts Musculoskeletal:negative for myalgias Neurological: negative for gait problems and tremors Behavioral/Psych: negative for abusive relationship, depression Endocrine: negative for temperature intolerance      Blood pressure (!) 152/77, pulse 77, weight 207 lb (93.9 kg).  Physical Exam Physical Exam General:   alert  Skin:   no rash or abnormalities  Lungs:   clear to auscultation bilaterally  Heart:   regular rate and rhythm, S1, S2 normal, no murmur, click, rub or gallop  Breasts:   fungal-type rash under each breast   50% of 15 min visit spent on counseling and coordination of care.   Data Reviewed Pathology: Unremarkable endocervical glandular tissue No identifiable endometrial tissue  Ultrasound: US  PELVIC COMPLETE WITH TRANSVAGINAL (Accession [id number]) (Order [id number])  Imaging  Date: 07/08/2018 Department: [facility] Released By: Toni Amend Authorizing: Shelly Bombard, MD  Exam Information   Status Exam Begun  Exam Ended   Final [99] 07/08/2018 9:08 AM 07/08/2018 10:13 AM  PACS Images   Show images for US PELVIC COMPLETE WITH TRANSVAGINAL  Addendum   ADDENDUM REPORT: 07/08/2018 13:16  ADDENDUM: Omitted from impression is the following:  Endometrial thickness of 9 mm  is abnormal for a postmenopausal patient.  In the setting of post-menopausal bleeding, endometrial sampling is indicated to exclude carcinoma. If results are benign, sonohysterogram should be considered for focal lesion work-up. (Ref: Radiological Reasoning: Algorithmic Workup of Abnormal Vaginal Bleeding with Endovaginal Sonography and Sonohysterography. AJR 2008; 865:H84-69)  These results will be called to the ordering clinician or representative by the Radiologist Assistant, and communication documented in the PACS or zVision Dashboard.   Electronically Signed   By: Lavonia Dana M.D.   On: 07/08/2018 13:16   Addended by Lavonia Dana, MD on 07/08/2018 1:19 PM    Study Result   CLINICAL DATA:  Abnormal uterine bleeding, prior endometrial ablation approximately 10 years ago; postmenopausal  EXAM: TRANSABDOMINAL AND TRANSVAGINAL ULTRASOUND OF PELVIS  TECHNIQUE: Both transabdominal and transvaginal ultrasound examinations of the pelvis were performed. Transabdominal technique was performed for global imaging of the pelvis including uterus, ovaries, adnexal regions, and pelvic cul-de-sac. It was necessary to proceed with endovaginal exam following the transabdominal exam to visualize the endometrium, and to characterize a cystic adnexal lesions.  COMPARISON:  None  FINDINGS: Uterus  Measurements: 12.5 x 6.1 x 8.9 cm = volume: 359 mL. Heterogeneous myometrium. Anterior wall leiomyoma at LEFT upper uterine segment 2.9 x 2.4 x 3.0 cm, intramural. Additional subserosal leiomyoma at posterior upper uterus, 4.2 x 3.4 x 3.3 cm, potentially extending submucosal.  Endometrium  Thickness: 9 mm.  No endometrial fluid or focal abnormality  Right ovary  Measurements: 3.1 x 2.4 x 2.4 cm = volume: 9.2 mL. RIGHT adnexal cystic mass identified 6.1 x 4.5 x 5.1 cm, simple features.  Left ovary  Measurements: 2.7 x 2.4 x 3.8 cm = volume: 13.7 mL. Complex  cystic mass adjacent to LEFT ovary uncertain if ovarian or paraovarian origin, see below.  Other findings  Exophytic complex cystic LEFT adnexal mass 6.8 x 5.4 x 4.4 cm of ovarian versus paraovarian origin containing scattered internal echoes and a mildly thickened septation up to 5 mm thick. No definite blood flow within septation on color Doppler imaging. No definite mural nodularity.No free pelvic fluid. No additional adnexal masses.  IMPRESSION: 2 uterine leiomyomata, the largest of which at the posterior upper uterus 4.2 cm greatest size could potentially extend submucosal.  Simple appearing RIGHT ovarian cyst 6.1 cm greatest size.  Complicated LEFT ovarian cyst 6.8 cm greatest size.  Surgical management recommended.  Electronically Signed: By: Lavonia Dana M.D. On: 07/08/2018 13:00       Assessment     1. Postmenopausal bleeding  2. Fibroids  3. History of endometrial ablation  4. Thickened endometrium  5. History of endometrial biopsy - specimen non-diagnostic.  No endometrial tissue obtained.  6. Cyst of left ovary, complex, septated, 6.8 cm  7. Superficial fungus infection of skin: Rx - clotrimazole (LOTRIMIN) 1 % cream; Apply 1 application topically 2 (two) times daily.  Dispense: 60 g; Refill: 2   Plan     Gyn Oncology Referral for further management recommendations  No orders of the defined types were placed in this encounter.  Meds ordered this encounter  Medications  . clotrimazole (LOTRIMIN) 1 % cream    Sig: Apply 1 application topically 2 (two) times daily.    Dispense:  60 g    Refill:  2    Shelly Bombard MD 07-17-2018

## 2018-07-20 ENCOUNTER — Telehealth: Payer: Self-pay | Admitting: *Deleted

## 2018-07-20 NOTE — Telephone Encounter (Signed)
Returned the patient's call and scheduled a new patient appt for 5/1

## 2018-08-21 ENCOUNTER — Ambulatory Visit: Admitting: Gynecologic Oncology

## 2018-09-23 ENCOUNTER — Inpatient Hospital Stay: Attending: Gynecologic Oncology | Admitting: Gynecologic Oncology

## 2018-09-23 ENCOUNTER — Encounter: Payer: Self-pay | Admitting: Gynecologic Oncology

## 2018-09-23 ENCOUNTER — Other Ambulatory Visit: Payer: Self-pay

## 2018-09-23 VITALS — BP 149/72 | HR 75 | Temp 98.9°F | Resp 18 | Ht 67.0 in | Wt 208.0 lb

## 2018-09-23 DIAGNOSIS — N83202 Unspecified ovarian cyst, left side: Secondary | ICD-10-CM | POA: Diagnosis not present

## 2018-09-23 DIAGNOSIS — N83201 Unspecified ovarian cyst, right side: Secondary | ICD-10-CM | POA: Diagnosis not present

## 2018-09-23 DIAGNOSIS — N950 Postmenopausal bleeding: Secondary | ICD-10-CM | POA: Insufficient documentation

## 2018-09-23 DIAGNOSIS — R9389 Abnormal findings on diagnostic imaging of other specified body structures: Secondary | ICD-10-CM | POA: Insufficient documentation

## 2018-09-23 MED ORDER — IBUPROFEN 600 MG PO TABS: 600.0000 mg | ORAL_TABLET | Freq: Four times a day (QID) | ORAL | 0 refills | Status: AC | PRN

## 2018-09-23 MED ORDER — OXYCODONE HCL 5 MG PO TABS
5.0000 mg | ORAL_TABLET | ORAL | 0 refills | Status: DC | PRN
Start: 2018-09-23 — End: 2020-04-25

## 2018-09-23 MED ORDER — SENNOSIDES-DOCUSATE SODIUM 8.6-50 MG PO TABS: 2.0000 | ORAL_TABLET | Freq: Every day | ORAL | 0 refills | Status: DC

## 2018-09-23 NOTE — Patient Instructions (Signed)
Preparing for your Surgery  Plan for surgery on October 15, 2018 with Dr. Everitt Amber at [facility] will be scheduled for a robotic assisted total laparoscopic hysterectomy, bilateral salpingo-oophorectomy, possible staging, possible mini laparotomy.   Pre-operative Testing -You will receive a phone call from presurgical testing at [facility] if you have not received a call already to arrange for a pre-operative testing appointment before your surgery.  This appointment normally occurs one to two weeks before your scheduled surgery.   -Bring your insurance card, copy of an advanced directive if applicable, medication list  -At that visit, you will be asked to sign a consent for a possible blood transfusion in case a transfusion becomes necessary during surgery.  The need for a blood transfusion is rare but having consent is a necessary part of your care.     -You should not be taking blood thinners or aspirin at least ten days prior to surgery unless instructed by your surgeon.  -Do not take supplements such as fish oil (omega 3), red yeast rice, tumeric before your surgery.  Day Before Surgery at [place] will be asked to take in a light diet the day before surgery.  Avoid carbonated beverages.  You will be advised to have nothing to eat or drink after midnight the evening before.    Eat a light diet the day before surgery.  Examples including soups, broths, toast, yogurt, mashed potatoes.  Things to avoid include carbonated beverages (fizzy beverages), raw fruits and raw vegetables, or beans.   If your bowels are filled with gas, your surgeon will have difficulty visualizing your pelvic organs which increases your surgical risks.  Your role in recovery Your role is to become active as soon as directed by your doctor, while still giving yourself time to heal.  Rest when you feel tired. You will be asked to do the following in order to speed your recovery:  - Cough and  breathe deeply. This helps toclear and expand your lungs and can prevent pneumonia. You may be given a spirometer to practice deep breathing. A staff member will show you how to use the spirometer. - Do mild physical activity. Walking or moving your legs help your circulation and body functions return to normal. A staff member will help you when you try to walk and will provide you with simple exercises. Do not try to get up or walk alone the first time. - Actively manage your pain. Managing your pain lets you move in comfort. We will ask you to rate your pain on a scale of zero to 10. It is your responsibility to tell your doctor or nurse where and how much you hurt so your pain can be treated.  Special Considerations -If you are diabetic, you may be placed on insulin after surgery to have closer control over your blood sugars to promote healing and recovery.  This does not mean that you will be discharged on insulin.  If applicable, your oral antidiabetics will be resumed when you are tolerating a solid diet.  -Your final pathology results from surgery should be available around one week after surgery and the results will be relayed to you when available.  -Dr. Lahoma Crocker is the surgeon that assists your GYN Oncologist with surgery.  If you end up staying the night, the next day after your surgery you will either see Dr. Denman Saravia or Dr. Lahoma Crocker.  -FMLA forms can be faxed to [phone] and please  allow 5-7 business days for completion.  Pain Management After Surgery -You have been prescribed your pain medication and bowel regimen medications before surgery so that you can have these available when you are discharged from the hospital. The pain medication is for use ONLY AFTER surgery and a new prescription will not be given.   -Make sure that you have Tylenol and Ibuprofen at home to use on a regular basis after surgery for pain control. We recommend alternating the medications  every hour to six hours since they work differently and are processed in the body differently for pain relief.  -Review the attached handout on narcotic use and their risks and side effects.   Bowel Regimen -You have been prescribed Sennakot-S to take nightly to prevent constipation especially if you are taking the narcotic pain medication intermittently.  It is important to prevent constipation and drink adequate amounts of liquids.  Blood Transfusion Information WHAT IS A BLOOD TRANSFUSION? A transfusion is the replacement of blood or some of its parts. Blood is made up of multiple cells which provide different functions.  Red blood cells carry oxygen and are used for blood loss replacement.  White blood cells fight against infection.  Platelets control bleeding.  Plasma helps clot blood.  Other blood products are available for specialized needs, such as hemophilia or other clotting disorders. BEFORE THE TRANSFUSION  Who gives blood for transfusions?   You may be able to donate blood to be used at a later date on yourself (autologous donation).  Relatives can be asked to donate blood. This is generally not any safer than if you have received blood from a stranger. The same precautions are taken to ensure safety when a relative's blood is donated.  Healthy volunteers who are fully evaluated to make sure their blood is safe. This is blood bank blood. Transfusion therapy is the safest it has ever been in the practice of medicine. Before blood is taken from a donor, a complete history is taken to make sure that person has no history of diseases nor engages in risky social behavior (examples are intravenous drug use or sexual activity with multiple partners). The donor's travel history is screened to minimize risk of transmitting infections, such as malaria. The donated blood is tested for signs of infectious diseases, such as HIV and hepatitis. The blood is then tested to be sure it is  compatible with you in order to minimize the chance of a transfusion reaction. If you or a relative donates blood, this is often done in anticipation of surgery and is not appropriate for emergency situations. It takes many days to process the donated blood. RISKS AND COMPLICATIONS Although transfusion therapy is very safe and saves many lives, the main dangers of transfusion include:   Getting an infectious disease.  Developing a transfusion reaction. This is an allergic reaction to something in the blood you were given. Every precaution is taken to prevent this. The decision to have a blood transfusion has been considered carefully by your caregiver before blood is given. Blood is not given unless the benefits outweigh the risks.  After Surgery Considerations  09/23/2018  Return to work: 4-6 weeks if applicable  Activity: 1. Be up and out of the bed during the day.  Take a nap if needed.  You may walk up steps but be careful and use the hand rail.  Stair climbing will tire you more than you think, you may need to stop part way and rest.  2. No lifting or straining for 6 weeks.  3. No driving for 1 week(s).  Do not drive if you are taking narcotic pain medicine.  4. Shower daily.  Use soap and water on your incision and pat dry; don't rub.  No tub baths until cleared by your surgeon.   5. No sexual activity and nothing in the vagina for 8 weeks.  6. You may experience a small amount of clear drainage from your incisions, which is normal.  If the drainage persists or increases, please call the office.  7. You may experience vaginal spotting after surgery or around the 6-8 week mark from surgery when the stitches at the top of the vagina begin to dissolve.  The spotting is normal but if you experience heavy bleeding, call our office.  8. Take Tylenol or ibuprofen first for pain and only use oxycodone for severe pain not relieved by the Tylenol or Ibuprofen.  Monitor your Tylenol intake to  a max of 4,000 mg a day.  Diet: 1. Low sodium Heart Healthy Diet is recommended.  2. It is safe to use a laxative, such as Miralax or Colace, if you have difficulty moving your bowels. You can take Sennakot at bedtime every evening to keep bowel movements regular and to prevent constipation.    Wound Care: 1. Keep clean and dry.  Shower daily.  Reasons to call the Doctor:  Fever - Oral temperature greater than 100.4 degrees Fahrenheit  Foul-smelling vaginal discharge  Difficulty urinating  Nausea and vomiting  Increased pain at the site of the incision that is unrelieved with pain medicine.  Difficulty breathing with or without chest pain  New calf pain especially if only on one side  Sudden, continuing increased vaginal bleeding with or without clots.

## 2018-09-23 NOTE — Progress Notes (Signed)
Consult Note: Gyn-Onc  Consult was requested by Dr. Jodi Mourning for the evaluation of Rose Scott 53 y.o. female  CC:  Chief Complaint  Patient presents with  . bilateral ovarian cysts  . thickened endometrium  . post-menopausal bleeding    Assessment/Plan:  Rose Scott  is a 53 y.o.  year old with bilateral ovarian cysts and thickened endometrium with post-menopausal bleeding in the setting of obesity.  She is not a good candidate for additional sampling due to her prior endometrial ablation.  I am recommending diagnostic hysterectomy and BSO with frozen section and staging if malignancy is found.  I offered the patient the option of attempted hysteroscopy and D&C (with concerns regarding sampling error) and surveillance of the ovaries. She declined this.  I discussed operative risks including  bleeding, infection, damage to internal organs (such as bladder,ureters, bowels), blood clot, reoperation and rehospitalization. I discussed anticipated outpatient procedure and discharge timing - I feel this can likely be an outpatient procedure with sameday discharge.  I discussed that due to the large size of her uterus, she may require a minilaparotomy for specimen removal.    HPI: [name] is a 53 year old P2 who is seen in consultation at the request of Dr Jodi Mourning for postmenopausal bleeding and bilateral ovarian cysts. The patient has a history of postmenopausal bleeding since 2018 approximately. 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  The endometrium was 9 mm.  The right ovary contained a simple appearing cyst that measured 6.1 x 4.5 x 5.1 cm.  The left ovary contained a complex  cystic mass object adjacent to the left ovary which is unclear if this is paraovarian and measured 6.8 x 5.4 x 4.4 cm.  Ca1 25 was drawn on July 10, 2018 which showed normal value of 5.1.   Due to the thickened endometrium on ultrasound a endometrial biopsy was performed for at least attempted on July 09, 2018 and this was revealing only for unremarkable endocervical glandular tissue but no endometrial tissue present.  A Pap smear to be performed on July 03, 2018 and was negative for dysplasia, negative for high-risk HPV.  The patient's medical history significant for obesity with a BMI of 33 kg/m.  She is had 2 prior vaginal deliveries.  She has prediabetes with a hemoglobin A1c in March 2020 at 6.7%.  Her surgical history significant for tubal ligation and an endometrial ablation which was performed in 2011.  She has experienced menopausal symptoms and believes she is postmenopausal.  She has never smoked.  She takes metformin for her diabetes.  Current Meds:  Outpatient Encounter Medications as of 09/23/2018  Medication Sig  . albuterol (PROVENTIL HFA;VENTOLIN HFA) 108 (90 Base) MCG/ACT inhaler Inhale into the lungs.  . clotrimazole (LOTRIMIN) 1 % cream Apply 1 application topically 2 (two) times daily.  Marland Kitchen loratadine (CLARITIN) 10 MG tablet Take by mouth.  . losartan-hydrochlorothiazide (HYZAAR) 50-12.5 MG tablet Take by mouth.  . metFORMIN (GLUCOPHAGE-XR) 500 MG 24 hr tablet Take by mouth.  Marland Kitchen ibuprofen (ADVIL) 600 MG tablet Take 1 tablet (600 mg total) by mouth every 6 (six) hours as needed. For AFTER surgery  . oxyCODONE (  OXY IR/ROXICODONE) 5 MG immediate release tablet Take 1 tablet (5 mg total) by mouth every 4 (four) hours as needed for severe pain. For AFTER surgery, do not take and drive  . senna-docusate (SENOKOT-S) 8.6-50 MG tablet Take 2 tablets by mouth at bedtime. For AFTER surgery, do not take if having diarrhea   No facility-administered encounter medications on file as of  09/23/2018.     Allergy:  Allergies  Allergen Reactions  . Multihance [Gadobenate] Nausea And Vomiting, Other (See Comments) and Cough    Pt became nauseous and started vomiting a couple of minutes after 19 mL Multihance given. Pt stated that her throat felt tight and like fluid was stuck in her throat. Pt's eyes were red. Dr. Joneen Caraway assessed pt and gave pt benedryl. Pt began to feel better after vomiting and benedryl given.   . Tape Rash    Needs paper tape    Social Hx:   Social History   Socioeconomic History  . Marital status: Married    Spouse name: Not on file  . Number of children: 2  . Years of education: Not on file  . Highest education level: Bachelor's degree (e.g., BA, AB, BS)  Occupational History  . Not on file  Social Needs  . Financial resource strain: Not on file  . Food insecurity:    Worry: Not on file    Inability: Not on file  . Transportation needs:    Medical: Not on file    Non-medical: Not on file  Tobacco Use  . Smoking status: Never Smoker  . Smokeless tobacco: Never Used  Substance and Sexual Activity  . Alcohol use: Yes    Comment: rare occ 1/2 glass of red wine  . Drug use: No  . Sexual activity: Yes    Partners: Male    Birth control/protection: None, Surgical  Lifestyle  . Physical activity:    Days per week: Not on file    Minutes per session: Not on file  . Stress: Not on file  Relationships  . Social connections:    Talks on phone: Not on file    Gets together: Not on file    Attends religious service: Not on file    Active member of club or organization: Not on file    Attends meetings of clubs or organizations: Not on file    Relationship status: Not on file  . Intimate partner violence:    Fear of current or ex partner: Not on file    Emotionally abused: Not on file    Physically abused: Not on file    Forced sexual activity: Not on file  Other Topics Concern  . Not on file  Social History Narrative   Lives at home with  her spouse   Right handed   Drinks 2+ cups of caffeine daily    Past Surgical Hx:  Past Surgical History:  Procedure Laterality Date  . ABLATION  2011   endometrial  . TUBAL LIGATION  1995    Past Medical Hx:  Past Medical History:  Diagnosis Date  . Anemia   . Pre-diabetes     Past Gynecological History:  P2, SVD x 2. No LMP recorded. Patient has had an ablation.  Family Hx:  Family History  Problem Relation Age of Onset  . Diabetes Mother   . Breast cancer Mother        in 45's  . COPD Father   . Congestive Heart Failure Father   .  Diabetes Maternal Grandmother   . Hypertension Maternal Grandmother   . Diabetes Maternal Grandfather   . Diabetes Paternal Grandmother   . Breast cancer Maternal Aunt   . Seizures Neg Hx     Review of Systems:  Constitutional  Feels well,    ENT Normal appearing ears and nares bilaterally Skin/Breast  No rash, sores, jaundice, itching, dryness Cardiovascular  No chest pain, shortness of breath, or edema  Pulmonary  No cough or wheeze.  Gastro Intestinal  No nausea, vomitting, or diarrhoea. No bright red blood per rectum, no abdominal pain, change in bowel movement, or constipation. Occasional bilateral pelvic pains. Genito Urinary  No frequency, urgency, dysuria, occasional spotting Musculo Skeletal  No myalgia, arthralgia, joint swelling or pain  Neurologic  No weakness, numbness, change in gait,  Psychology  No depression, anxiety, insomnia.   Vitals:  Blood pressure (!) 149/72, pulse 75, temperature 98.9 F (37.2 C), temperature source Oral, resp. rate 18, height 5\' 7"  (1.702 m), weight 208 lb (94.3 kg), SpO2 100 %.  Physical Exam: WD in NAD Neck  Supple NROM, without any enlargements.  Lymph Node Survey No cervical supraclavicular or inguinal adenopathy Cardiovascular  Pulse normal rate, regularity and rhythm. S1 and S2 normal.  Lungs  Clear to auscultation bilateraly, without wheezes/crackles/rhonchi. Good  air movement.  Skin  No rash/lesions/breakdown  Psychiatry  Alert and oriented to person, place, and time  Abdomen  Normoactive bowel sounds, abdomen soft, non-tender and obese without evidence of hernia.  Back No CVA tenderness Genito Urinary  Vulva/vagina: Normal external female genitalia.   No lesions. No discharge or bleeding.  Bladder/urethra:  No lesions or masses, well supported bladder  Vagina: normal  Cervix: Normal appearing, no lesions.  Uterus:  Bulky, mobile, no parametrial involvement or nodularity.  Adnexa: no discrete masses. Narrow pubic arch Rectal  deferred Extremities  No bilateral cyanosis, clubbing or edema.   Thereasa Solo, MD  09/23/2018, 5:19 PM

## 2018-09-23 NOTE — H&P (View-Only) (Signed)
Consult Note: Gyn-Onc  Consult was requested by Dr. Jodi Mourning for the evaluation of Rose Scott 53 y.o. female  CC:  Chief Complaint  Patient presents with  . bilateral ovarian cysts  . thickened endometrium  . post-menopausal bleeding    Assessment/Plan:  Rose Scott  is a 53 y.o.  year old with bilateral ovarian cysts and thickened endometrium with post-menopausal bleeding in the setting of obesity.  She is not a good candidate for additional sampling due to her prior endometrial ablation.  I am recommending diagnostic hysterectomy and BSO with frozen section and staging if malignancy is found.  I offered the patient the option of attempted hysteroscopy and D&C (with concerns regarding sampling error) and surveillance of the ovaries. She declined this.  I discussed operative risks including  bleeding, infection, damage to internal organs (such as bladder,ureters, bowels), blood clot, reoperation and rehospitalization. I discussed anticipated outpatient procedure and discharge timing - I feel this can likely be an outpatient procedure with sameday discharge.  I discussed that due to the large size of her uterus, she may require a minilaparotomy for specimen removal.    HPI: Rose Scott is a 53 year old P2 who is seen in consultation at the request of Dr Jodi Mourning for postmenopausal bleeding and bilateral ovarian cysts. The patient has a history of postmenopausal bleeding since 2018 approximately. 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  The endometrium was 9 mm.  The right ovary contained a simple appearing cyst that measured 6.1 x 4.5 x 5.1 cm.  The left ovary contained a complex  cystic mass object adjacent to the left ovary which is unclear if this is paraovarian and measured 6.8 x 5.4 x 4.4 cm.  Ca1 25 was drawn on July 10, 2018 which showed normal value of 5.1.   Due to the thickened endometrium on ultrasound a endometrial biopsy was performed for at least attempted on July 09, 2018 and this was revealing only for unremarkable endocervical glandular tissue but no endometrial tissue present.  A Pap smear to be performed on July 03, 2018 and was negative for dysplasia, negative for high-risk HPV.  The patient's medical history significant for obesity with a BMI of 33 kg/m.  She is had 2 prior vaginal deliveries.  She has prediabetes with a hemoglobin A1c in March 2020 at 6.7%.  Her surgical history significant for tubal ligation and an endometrial ablation which was performed in 2011.  She has experienced menopausal symptoms and believes she is postmenopausal.  She has never smoked.  She takes metformin for her diabetes.  Current Meds:  Outpatient Encounter Medications as of 09/23/2018  Medication Sig  . albuterol (PROVENTIL HFA;VENTOLIN HFA) 108 (90 Base) MCG/ACT inhaler Inhale into the lungs.  . clotrimazole (LOTRIMIN) 1 % cream Apply 1 application topically 2 (two) times daily.  Marland Kitchen loratadine (CLARITIN) 10 MG tablet Take by mouth.  . losartan-hydrochlorothiazide (HYZAAR) 50-12.5 MG tablet Take by mouth.  . metFORMIN (GLUCOPHAGE-XR) 500 MG 24 hr tablet Take by mouth.  Marland Kitchen ibuprofen (ADVIL) 600 MG tablet Take 1 tablet (600 mg total) by mouth every 6 (six) hours as needed. For AFTER surgery  . oxyCODONE (  OXY IR/ROXICODONE) 5 MG immediate release tablet Take 1 tablet (5 mg total) by mouth every 4 (four) hours as needed for severe pain. For AFTER surgery, do not take and drive  . senna-docusate (SENOKOT-S) 8.6-50 MG tablet Take 2 tablets by mouth at bedtime. For AFTER surgery, do not take if having diarrhea   No facility-administered encounter medications on file as of  09/23/2018.     Allergy:  Allergies  Allergen Reactions  . Multihance [Gadobenate] Nausea And Vomiting, Other (See Comments) and Cough    Pt became nauseous and started vomiting a couple of minutes after 19 mL Multihance given. Pt stated that her throat felt tight and like fluid was stuck in her throat. Pt's eyes were red. Dr. Joneen Caraway assessed pt and gave pt benedryl. Pt began to feel better after vomiting and benedryl given.   . Tape Rash    Needs paper tape    Social Hx:   Social History   Socioeconomic History  . Marital status: Married    Spouse name: Not on file  . Number of children: 2  . Years of education: Not on file  . Highest education level: Bachelor's degree (e.g., BA, AB, BS)  Occupational History  . Not on file  Social Needs  . Financial resource strain: Not on file  . Food insecurity:    Worry: Not on file    Inability: Not on file  . Transportation needs:    Medical: Not on file    Non-medical: Not on file  Tobacco Use  . Smoking status: Never Smoker  . Smokeless tobacco: Never Used  Substance and Sexual Activity  . Alcohol use: Yes    Comment: rare occ 1/2 glass of red wine  . Drug use: No  . Sexual activity: Yes    Partners: Male    Birth control/protection: None, Surgical  Lifestyle  . Physical activity:    Days per week: Not on file    Minutes per session: Not on file  . Stress: Not on file  Relationships  . Social connections:    Talks on phone: Not on file    Gets together: Not on file    Attends religious service: Not on file    Active member of club or organization: Not on file    Attends meetings of clubs or organizations: Not on file    Relationship status: Not on file  . Intimate partner violence:    Fear of current or ex partner: Not on file    Emotionally abused: Not on file    Physically abused: Not on file    Forced sexual activity: Not on file  Other Topics Concern  . Not on file  Social History Narrative   Lives at home with  her spouse   Right handed   Drinks 2+ cups of caffeine daily    Past Surgical Hx:  Past Surgical History:  Procedure Laterality Date  . ABLATION  2011   endometrial  . TUBAL LIGATION  1995    Past Medical Hx:  Past Medical History:  Diagnosis Date  . Anemia   . Pre-diabetes     Past Gynecological History:  P2, SVD x 2. No LMP recorded. Patient has had an ablation.  Family Hx:  Family History  Problem Relation Age of Onset  . Diabetes Mother   . Breast cancer Mother        in 31's  . COPD Father   . Congestive Heart Failure Father   .  Diabetes Maternal Grandmother   . Hypertension Maternal Grandmother   . Diabetes Maternal Grandfather   . Diabetes Paternal Grandmother   . Breast cancer Maternal Aunt   . Seizures Neg Hx     Review of Systems:  Constitutional  Feels well,    ENT Normal appearing ears and nares bilaterally Skin/Breast  No rash, sores, jaundice, itching, dryness Cardiovascular  No chest pain, shortness of breath, or edema  Pulmonary  No cough or wheeze.  Gastro Intestinal  No nausea, vomitting, or diarrhoea. No bright red blood per rectum, no abdominal pain, change in bowel movement, or constipation. Occasional bilateral pelvic pains. Genito Urinary  No frequency, urgency, dysuria, occasional spotting Musculo Skeletal  No myalgia, arthralgia, joint swelling or pain  Neurologic  No weakness, numbness, change in gait,  Psychology  No depression, anxiety, insomnia.   Vitals:  Blood pressure (!) 149/72, pulse 75, temperature 98.9 F (37.2 C), temperature source Oral, resp. rate 18, height 5\' 7"  (1.702 m), weight 208 lb (94.3 kg), SpO2 100 %.  Physical Exam: WD in NAD Neck  Supple NROM, without any enlargements.  Lymph Node Survey No cervical supraclavicular or inguinal adenopathy Cardiovascular  Pulse normal rate, regularity and rhythm. S1 and S2 normal.  Lungs  Clear to auscultation bilateraly, without wheezes/crackles/rhonchi. Good  air movement.  Skin  No rash/lesions/breakdown  Psychiatry  Alert and oriented to person, place, and time  Abdomen  Normoactive bowel sounds, abdomen soft, non-tender and obese without evidence of hernia.  Back No CVA tenderness Genito Urinary  Vulva/vagina: Normal external female genitalia.   No lesions. No discharge or bleeding.  Bladder/urethra:  No lesions or masses, well supported bladder  Vagina: normal  Cervix: Normal appearing, no lesions.  Uterus:  Bulky, mobile, no parametrial involvement or nodularity.  Adnexa: no discrete masses. Narrow pubic arch Rectal  deferred Extremities  No bilateral cyanosis, clubbing or edema.   Thereasa Solo, MD  09/23/2018, 5:19 PM

## 2018-09-25 ENCOUNTER — Other Ambulatory Visit: Payer: Self-pay | Admitting: Gynecologic Oncology

## 2018-09-25 DIAGNOSIS — N83201 Unspecified ovarian cyst, right side: Secondary | ICD-10-CM

## 2018-09-28 ENCOUNTER — Encounter (HOSPITAL_BASED_OUTPATIENT_CLINIC_OR_DEPARTMENT_OTHER): Payer: Self-pay | Admitting: *Deleted

## 2018-10-08 ENCOUNTER — Telehealth: Payer: Self-pay | Admitting: Gynecologic Oncology

## 2018-10-08 NOTE — Telephone Encounter (Signed)
Attempted to call pt to see if she had any questions or concerns about her upcoming surgery for next week.  She has pre-op scheduled for tomorrow. Left message advising her to call the office for any questions or concerns.

## 2018-10-09 ENCOUNTER — Other Ambulatory Visit: Payer: Self-pay

## 2018-10-09 ENCOUNTER — Encounter (HOSPITAL_COMMUNITY)
Admission: RE | Admit: 2018-10-09 | Discharge: 2018-10-09 | Disposition: A | Discharge status: Home / Self Care | Source: Ambulatory Visit | Attending: Gynecologic Oncology | Admitting: Gynecologic Oncology

## 2018-10-09 ENCOUNTER — Encounter (HOSPITAL_COMMUNITY): Payer: Self-pay

## 2018-10-09 DIAGNOSIS — Z1159 Encounter for screening for other viral diseases: Secondary | ICD-10-CM | POA: Diagnosis not present

## 2018-10-09 DIAGNOSIS — Z01818 Encounter for other preprocedural examination: Secondary | ICD-10-CM | POA: Diagnosis present

## 2018-10-09 DIAGNOSIS — E119 Type 2 diabetes mellitus without complications: Secondary | ICD-10-CM | POA: Insufficient documentation

## 2018-10-09 DIAGNOSIS — D259 Leiomyoma of uterus, unspecified: Secondary | ICD-10-CM | POA: Diagnosis not present

## 2018-10-09 DIAGNOSIS — I10 Essential (primary) hypertension: Secondary | ICD-10-CM | POA: Diagnosis not present

## 2018-10-09 DIAGNOSIS — N83202 Unspecified ovarian cyst, left side: Secondary | ICD-10-CM | POA: Diagnosis not present

## 2018-10-09 DIAGNOSIS — N950 Postmenopausal bleeding: Secondary | ICD-10-CM | POA: Insufficient documentation

## 2018-10-09 DIAGNOSIS — N83201 Unspecified ovarian cyst, right side: Secondary | ICD-10-CM | POA: Insufficient documentation

## 2018-10-09 HISTORY — DX: Gastro-esophageal reflux disease without esophagitis: K21.9

## 2018-10-09 HISTORY — DX: Essential (primary) hypertension: I10

## 2018-10-09 HISTORY — DX: Unspecified asthma, uncomplicated: J45.909

## 2018-10-09 HISTORY — DX: Other allergy status, other than to drugs and biological substances: Z91.09

## 2018-10-09 LAB — CBC
HCT: 32.7 % — ABNORMAL LOW (ref 36.0–46.0)
Hemoglobin: 9.5 g/dL — ABNORMAL LOW (ref 12.0–15.0)
MCH: 22.6 pg — ABNORMAL LOW (ref 26.0–34.0)
MCHC: 29.1 g/dL — ABNORMAL LOW (ref 30.0–36.0)
MCV: 77.7 fL — ABNORMAL LOW (ref 80.0–100.0)
Platelets: 279 10*3/uL (ref 150–400)
RBC: 4.21 MIL/uL (ref 3.87–5.11)
RDW: 14.6 % (ref 11.5–15.5)
WBC: 5.3 10*3/uL (ref 4.0–10.5)
nRBC: 0 % (ref 0.0–0.2)

## 2018-10-09 LAB — COMPREHENSIVE METABOLIC PANEL
ALT: 15 U/L (ref 0–44)
AST: 17 U/L (ref 15–41)
Albumin: 4 g/dL (ref 3.5–5.0)
Alkaline Phosphatase: 61 U/L (ref 38–126)
Anion gap: 7 (ref 5–15)
BUN: 14 mg/dL (ref 6–20)
CO2: 26 mmol/L (ref 22–32)
Calcium: 9.2 mg/dL (ref 8.9–10.3)
Chloride: 104 mmol/L (ref 98–111)
Creatinine, Ser: 0.79 mg/dL (ref 0.44–1.00)
GFR calc Af Amer: >60 mL/min (ref >60)
GFR calc non Af Amer: >60 mL/min (ref >60)
Glucose, Bld: 107 mg/dL — ABNORMAL HIGH (ref 70–99)
Potassium: 4.1 mmol/L (ref 3.5–5.1)
Sodium: 137 mmol/L (ref 135–145)
Total Bilirubin: 0.5 mg/dL (ref 0.3–1.2)
Total Protein: 7.2 g/dL (ref 6.5–8.1)

## 2018-10-09 LAB — URINALYSIS, ROUTINE W REFLEX MICROSCOPIC
Bilirubin Urine: NEGATIVE
Glucose, UA: NEGATIVE mg/dL
Hgb urine dipstick: NEGATIVE
Ketones, ur: NEGATIVE mg/dL
Leukocytes,Ua: NEGATIVE
Nitrite: NEGATIVE
Protein, ur: 30 mg/dL — AB
Specific Gravity, Urine: 1.014 (ref 1.005–1.030)
pH: 6 (ref 5.0–8.0)

## 2018-10-09 LAB — HEMOGLOBIN A1C
Hgb A1c MFr Bld: 6.7 % — ABNORMAL HIGH (ref 4.8–5.6)
Mean Plasma Glucose: 145.59 mg/dL

## 2018-10-09 LAB — GLUCOSE, CAPILLARY: Glucose-Capillary: 134 mg/dL — ABNORMAL HIGH (ref 70–99)

## 2018-10-09 LAB — ABO/RH: ABO/RH(D): A POS

## 2018-10-09 NOTE — Progress Notes (Signed)
Hemoglobin 9.5. cahrt given to APP for review. patients vitals WDL at pre-op appt on 6-19.

## 2018-10-09 NOTE — Patient Instructions (Signed)
YOU ARE REQUIRED TO BE TESTED FOR COVID-19 PRIOR TO YOUR SURGERY . YOUR TEST MUST BE COMPLETED ON Monday June 22nd. TESTING IS LOCATED AT [facility] FROM 9:00AM - 3:00PM. FAILURE TO COMPLETE TESTING MAY RESULT IN CANCELLATION OF YOUR SURGERY.  ONCE YOUR COVID TEST IS COMPLETED, PLEASE BEGIN THE QUARANTINE INSTRUCTIONS AS OUTLINED IN YOUR HANDOUT.                 Rose Scott    Your procedure is scheduled on: 10-15-2018   Report to [facility] Main  Entrance    Report to admitting at 9:45AM      Call this number if you have problems the morning of surgery [phone]     Remember: Eat a light diet the day before surgery.  Examples including soups, broths, toast, yogurt, mashed potatoes.  Things to avoid include carbonated beverages (fizzy beverages), raw fruits and raw vegetables, or beans. If your bowels are filled with gas, your surgeon will have difficulty visualizing your pelvic organs which increases your surgical risks.  BRUSH YOUR TEETH MORNING OF SURGERY AND RINSE YOUR MOUTH OUT, NO CHEWING GUM CANDY OR MINTS.   Do not eat food After Midnight. YOU MAY HAVE CLEAR LIQUIDS FROM MIDNIGHT UNTIL 8:45AM. Nothing by mouth after 8:45AM !        CLEAR LIQUID DIET   Foods Allowed                                                                     Foods Excluded  Coffee and tea, regular and decaf                             liquids that you cannot  Plain Jell-O in any flavor                                             see through such as: Fruit ices (not with fruit pulp)                                     milk, soups, orange juice  Iced Popsicles                                    All solid food Carbonated beverages, regular and diet                                    Cranberry, grape and apple juices Sports drinks like Gatorade Lightly seasoned clear broth or consume(fat free) Sugar, honey syrup  Sample Menu Breakfast                                 Lunch  Supper Cranberry juice                    Beef broth                            Chicken broth Jell-O                                     Grape juice                           Apple juice Coffee or tea                        Jell-O                                      Popsicle                                                Coffee or tea                        Coffee or tea  _____________________________________________________________________        Take these medicines the morning of surgery with A SIP OF WATER: pantoprazole  DO NOT TAKE ANY DIABETIC MEDICATIONS DAY OF YOUR SURGERY!!!                                You may not have any metal on your body including hair pins and              piercings  Do not wear jewelry, make-up, lotions, powders or perfumes, deodorant             Do not wear nail polish.  Do not shave  48 hours prior to surgery.     Do not bring valuables to the hospital. [place].  Contacts, dentures or bridgework may not be worn into surgery.  Leave suitcase in the car. After surgery it may be brought to your room.     Patients discharged the day of surgery will not be allowed to drive home. IF YOU ARE HAVING SURGERY AND GOING HOME THE SAME DAY, YOU MUST HAVE AN ADULT TO DRIVE YOU HOME AND BE WITH YOU FOR 24 HOURS. YOU MAY GO HOME BY TAXI OR UBER OR ORTHERWISE, BUT AN ADULT MUST ACCOMPANY YOU HOME AND STAY WITH YOU FOR 24 HOURS.  Name and phone number of your driver:  Special Instructions: N/A              Please read over the following fact sheets you were given: _____________________________________________________________________             [facility] - Preparing for Surgery Before surgery, you can play an important role.  Because skin is not sterile, your skin needs to be as free of germs as possible.  You can reduce the  number of  germs on your skin by washing with CHG (chlorahexidine gluconate) soap before surgery.  CHG is an antiseptic cleaner which kills germs and bonds with the skin to continue killing germs even after washing. Please DO NOT use if you have an allergy to CHG or antibacterial soaps.  If your skin becomes reddened/irritated stop using the CHG and inform your nurse when you arrive at Short Stay. Do not shave (including legs and underarms) for at least 48 hours prior to the first CHG shower.  You may shave your face/neck. Please follow these instructions carefully:  1.  Shower with CHG Soap the night before surgery and the  morning of Surgery.  2.  If you choose to wash your hair, wash your hair first as usual with your  normal  shampoo.  3.  After you shampoo, rinse your hair and body thoroughly to remove the  shampoo.                           4.  Use CHG as you would any other liquid soap.  You can apply chg directly  to the skin and wash                       Gently with a scrungie or clean washcloth.  5.  Apply the CHG Soap to your body ONLY FROM THE NECK DOWN.   Do not use on face/ open                           Wound or open sores. Avoid contact with eyes, ears mouth and genitals (private parts).                       Wash face,  Genitals (private parts) with your normal soap.             6.  Wash thoroughly, paying special attention to the area where your surgery  will be performed.  7.  Thoroughly rinse your body with warm water from the neck down.  8.  DO NOT shower/wash with your normal soap after using and rinsing off  the CHG Soap.                9.  Pat yourself dry with a clean towel.            10.  Wear clean pajamas.            11.  Place clean sheets on your bed the night of your first shower and do not  sleep with pets. Day of Surgery : Do not apply any lotions/deodorants the morning of surgery.  Please wear clean clothes to the hospital/surgery center.  FAILURE TO FOLLOW THESE  INSTRUCTIONS MAY RESULT IN THE CANCELLATION OF YOUR SURGERY PATIENT SIGNATURE_________________________________  NURSE SIGNATURE__________________________________  ________________________________________________________________________   Rose Scott  An incentive spirometer is a tool that can help keep your lungs clear and active. This tool measures how well you are filling your lungs with each breath. Taking long deep breaths may help reverse or decrease the chance of developing breathing (pulmonary) problems (especially infection) following:  A long period of time when you are unable to move or be active. BEFORE THE PROCEDURE   If the spirometer includes an indicator to show your best effort, your nurse or respiratory therapist will set it to a  desired goal.  If possible, sit up straight or lean slightly forward. Try not to slouch.  Hold the incentive spirometer in an upright position. INSTRUCTIONS FOR USE  1. Sit on the edge of your bed if possible, or sit up as far as you can in bed or on a chair. 2. Hold the incentive spirometer in an upright position. 3. Breathe out normally. 4. Place the mouthpiece in your mouth and seal your lips tightly around it. 5. Breathe in slowly and as deeply as possible, raising the piston or the ball toward the top of the column. 6. Hold your breath for 3-5 seconds or for as long as possible. Allow the piston or ball to fall to the bottom of the column. 7. Remove the mouthpiece from your mouth and breathe out normally. 8. Rest for a few seconds and repeat Steps 1 through 7 at least 10 times every 1-2 hours when you are awake. Take your time and take a few normal breaths between deep breaths. 9. The spirometer may include an indicator to show your best effort. Use the indicator as a goal to work toward during each repetition. 10. After each set of 10 deep breaths, practice coughing to be sure your lungs are clear. If you have an incision (the  cut made at the time of surgery), support your incision when coughing by placing a pillow or rolled up towels firmly against it. Once you are able to get out of bed, walk around indoors and cough well. You may stop using the incentive spirometer when instructed by your caregiver.  RISKS AND COMPLICATIONS  Take your time so you do not get dizzy or light-headed.  If you are in pain, you may need to take or ask for pain medication before doing incentive spirometry. It is harder to take a deep breath if you are having pain. AFTER USE  Rest and breathe slowly and easily.  It can be helpful to keep track of a log of your progress. Your caregiver can provide you with a simple table to help with this. If you are using the spirometer at home, follow these instructions: [place] IF:   You are having difficultly using the spirometer.  You have trouble using the spirometer as often as instructed.  Your pain medication is not giving enough relief while using the spirometer.  You develop fever of 100.5 F (38.1 C) or higher. SEEK IMMEDIATE MEDICAL CARE IF:   You cough up bloody sputum that had not been present before.  You develop fever of 102 F (38.9 C) or greater.  You develop worsening pain at or near the incision site. MAKE SURE YOU:   Understand these instructions.  Will watch your condition.  Will get help right away if you are not doing well or get worse. Document Released: 08/19/2006 Document Revised: 07/01/2011 Document Reviewed: 10/20/2006 Exit[employer] Patient Information 2014 [employer], [place].   ________________________________________________________________________  WHAT IS A BLOOD TRANSFUSION? Blood Transfusion Information  A transfusion is the replacement of blood or some of its parts. Blood is made up of multiple cells which provide different functions.  Red blood cells carry oxygen and are used for blood loss replacement.  White blood cells fight against  infection.  Platelets control bleeding.  Plasma helps clot blood.  Other blood products are available for specialized needs, such as hemophilia or other clotting disorders. BEFORE THE TRANSFUSION  Who gives blood for transfusions?   Healthy volunteers who are fully evaluated to make sure  their blood is safe. This is blood bank blood. Transfusion therapy is the safest it has ever been in the practice of medicine. Before blood is taken from a donor, a complete history is taken to make sure that person has no history of diseases nor engages in risky social behavior (examples are intravenous drug use or sexual activity with multiple partners). The donor's travel history is screened to minimize risk of transmitting infections, such as malaria. The donated blood is tested for signs of infectious diseases, such as HIV and hepatitis. The blood is then tested to be sure it is compatible with you in order to minimize the chance of a transfusion reaction. If you or a relative donates blood, this is often done in anticipation of surgery and is not appropriate for emergency situations. It takes many days to process the donated blood. RISKS AND COMPLICATIONS Although transfusion therapy is very safe and saves many lives, the main dangers of transfusion include:   Getting an infectious disease.  Developing a transfusion reaction. This is an allergic reaction to something in the blood you were given. Every precaution is taken to prevent this. The decision to have a blood transfusion has been considered carefully by your caregiver before blood is given. Blood is not given unless the benefits outweigh the risks. AFTER THE TRANSFUSION  Right after receiving a blood transfusion, you will usually feel much better and more energetic. This is especially true if your red blood cells have gotten low (anemic). The transfusion raises the level of the red blood cells which carry oxygen, and this usually causes an energy  increase.  The nurse administering the transfusion will monitor you carefully for complications. HOME CARE INSTRUCTIONS  No special instructions are needed after a transfusion. You may find your energy is better. Speak with your caregiver about any limitations on activity for underlying diseases you may have. SEEK MEDICAL CARE IF:   Your condition is not improving after your transfusion.  You develop redness or irritation at the intravenous (IV) site. SEEK IMMEDIATE MEDICAL CARE IF:  Any of the following symptoms occur over the next 12 hours:  Shaking chills.  You have a temperature by mouth above 102 F (38.9 C), not controlled by medicine.  Chest, back, or muscle pain.  People around you feel you are not acting correctly or are confused.  Shortness of breath or difficulty breathing.  Dizziness and fainting.  You get a rash or develop hives.  You have a decrease in urine output.  Your urine turns a dark color or changes to pink, red, or brown. Any of the following symptoms occur over the next 10 days:  You have a temperature by mouth above 102 F (38.9 C), not controlled by medicine.  Shortness of breath.  Weakness after normal activity.  The white part of the eye turns yellow (jaundice).  You have a decrease in the amount of urine or are urinating less often.  Your urine turns a dark color or changes to pink, red, or brown. Document Released: 04/05/2000 Document Revised: 07/01/2011 Document Reviewed: 11/23/2007 [facility] Patient Information 2014 [place], [place].  _______________________________________________________________________

## 2018-10-12 ENCOUNTER — Other Ambulatory Visit (HOSPITAL_COMMUNITY)
Admission: RE | Admit: 2018-10-12 | Discharge: 2018-10-12 | Disposition: A | Discharge status: Home / Self Care | Source: Ambulatory Visit | Attending: Gynecologic Oncology | Admitting: Gynecologic Oncology

## 2018-10-12 DIAGNOSIS — Z01818 Encounter for other preprocedural examination: Secondary | ICD-10-CM | POA: Diagnosis not present

## 2018-10-12 LAB — SARS CORONAVIRUS 2 (TAT 6-24 HRS): SARS Coronavirus 2: NEGATIVE

## 2018-10-15 ENCOUNTER — Ambulatory Visit (HOSPITAL_COMMUNITY)
Admission: RE | Admit: 2018-10-15 | Discharge: 2018-10-15 | Disposition: A | Discharge status: Home / Self Care | Source: Ambulatory Visit | Attending: Gynecologic Oncology | Admitting: Gynecologic Oncology

## 2018-10-15 ENCOUNTER — Other Ambulatory Visit: Payer: Self-pay

## 2018-10-15 ENCOUNTER — Encounter (HOSPITAL_COMMUNITY): Payer: Self-pay

## 2018-10-15 ENCOUNTER — Encounter (HOSPITAL_COMMUNITY)
Admission: RE | Disposition: A | Discharge status: Home / Self Care | Payer: Self-pay | Source: Ambulatory Visit | Attending: Gynecologic Oncology

## 2018-10-15 ENCOUNTER — Ambulatory Visit (HOSPITAL_COMMUNITY): Admitting: Certified Registered Nurse Anesthetist

## 2018-10-15 ENCOUNTER — Ambulatory Visit (HOSPITAL_COMMUNITY): Admitting: Physician Assistant

## 2018-10-15 DIAGNOSIS — Z888 Allergy status to other drugs, medicaments and biological substances status: Secondary | ICD-10-CM | POA: Insufficient documentation

## 2018-10-15 DIAGNOSIS — D270 Benign neoplasm of right ovary: Secondary | ICD-10-CM | POA: Insufficient documentation

## 2018-10-15 DIAGNOSIS — Z7984 Long term (current) use of oral hypoglycemic drugs: Secondary | ICD-10-CM | POA: Insufficient documentation

## 2018-10-15 DIAGNOSIS — N800 Endometriosis of uterus: Secondary | ICD-10-CM | POA: Insufficient documentation

## 2018-10-15 DIAGNOSIS — N83202 Unspecified ovarian cyst, left side: Secondary | ICD-10-CM

## 2018-10-15 DIAGNOSIS — E669 Obesity, unspecified: Secondary | ICD-10-CM

## 2018-10-15 DIAGNOSIS — D259 Leiomyoma of uterus, unspecified: Secondary | ICD-10-CM

## 2018-10-15 DIAGNOSIS — N83201 Unspecified ovarian cyst, right side: Secondary | ICD-10-CM

## 2018-10-15 DIAGNOSIS — D251 Intramural leiomyoma of uterus: Secondary | ICD-10-CM

## 2018-10-15 DIAGNOSIS — N950 Postmenopausal bleeding: Secondary | ICD-10-CM

## 2018-10-15 DIAGNOSIS — E119 Type 2 diabetes mellitus without complications: Secondary | ICD-10-CM | POA: Insufficient documentation

## 2018-10-15 DIAGNOSIS — I10 Essential (primary) hypertension: Secondary | ICD-10-CM | POA: Diagnosis not present

## 2018-10-15 DIAGNOSIS — J45909 Unspecified asthma, uncomplicated: Secondary | ICD-10-CM | POA: Insufficient documentation

## 2018-10-15 DIAGNOSIS — N8312 Corpus luteum cyst of left ovary: Secondary | ICD-10-CM | POA: Diagnosis not present

## 2018-10-15 DIAGNOSIS — Z79899 Other long term (current) drug therapy: Secondary | ICD-10-CM | POA: Insufficient documentation

## 2018-10-15 DIAGNOSIS — Z6835 Body mass index (BMI) 35.0-35.9, adult: Secondary | ICD-10-CM | POA: Insufficient documentation

## 2018-10-15 HISTORY — PX: ROBOTIC ASSISTED TOTAL HYSTERECTOMY WITH BILATERAL SALPINGO OOPHERECTOMY: SHX6086

## 2018-10-15 LAB — GLUCOSE, CAPILLARY
Glucose-Capillary: 104 mg/dL — ABNORMAL HIGH (ref 70–99)
Glucose-Capillary: 115 mg/dL — ABNORMAL HIGH (ref 70–99)

## 2018-10-15 LAB — TYPE AND SCREEN
ABO/RH(D): A POS
Antibody Screen: NEGATIVE

## 2018-10-15 SURGERY — HYSTERECTOMY, TOTAL, ROBOT-ASSISTED, LAPAROSCOPIC, WITH BILATERAL SALPINGO-OOPHORECTOMY
Anesthesia: General | Laterality: Bilateral

## 2018-10-15 MED ORDER — DEXAMETHASONE SODIUM PHOSPHATE 4 MG/ML IJ SOLN: 4.0000 mg | INTRAMUSCULAR | Status: DC

## 2018-10-15 MED ORDER — LACTATED RINGERS IV SOLN
INTRAVENOUS | Status: DC
  Administered 2018-10-15 (×2): via INTRAVENOUS

## 2018-10-15 MED ORDER — MEPERIDINE HCL 50 MG/ML IJ SOLN
6.2500 mg | INTRAMUSCULAR | Status: DC | PRN
  Administered 2018-10-15: 17:00:00 6.25 mg via INTRAVENOUS

## 2018-10-15 MED ORDER — PHENYLEPHRINE 40 MCG/ML (10ML) SYRINGE FOR IV PUSH (FOR BLOOD PRESSURE SUPPORT)
PREFILLED_SYRINGE | INTRAVENOUS | Status: AC
  Filled 2018-10-15: qty 10

## 2018-10-15 MED ORDER — ONDANSETRON HCL 4 MG/2ML IJ SOLN
INTRAMUSCULAR | Status: AC
  Filled 2018-10-15: qty 2

## 2018-10-15 MED ORDER — KETAMINE HCL 10 MG/ML IJ SOLN
INTRAMUSCULAR | Status: DC | PRN
  Administered 2018-10-15: 30 mg via INTRAVENOUS

## 2018-10-15 MED ORDER — MIDAZOLAM HCL 5 MG/5ML IJ SOLN
INTRAMUSCULAR | Status: DC | PRN
  Administered 2018-10-15 (×2): 1 mg via INTRAVENOUS

## 2018-10-15 MED ORDER — LIDOCAINE 2% (20 MG/ML) 5 ML SYRINGE
INTRAMUSCULAR | Status: DC | PRN
  Administered 2018-10-15: 80 mg via INTRAVENOUS

## 2018-10-15 MED ORDER — LIDOCAINE 2% (20 MG/ML) 5 ML SYRINGE
INTRAMUSCULAR | Status: AC
  Filled 2018-10-15: qty 5

## 2018-10-15 MED ORDER — STERILE WATER FOR IRRIGATION IR SOLN
Status: DC | PRN
  Administered 2018-10-15: 1000 mL

## 2018-10-15 MED ORDER — ROCURONIUM BROMIDE 100 MG/10ML IV SOLN: INTRAVENOUS | Status: DC | PRN

## 2018-10-15 MED ORDER — BUPIVACAINE HCL (PF) 0.25 % IJ SOLN
INTRAMUSCULAR | Status: AC
  Filled 2018-10-15: qty 30

## 2018-10-15 MED ORDER — ROCURONIUM BROMIDE 50 MG/5ML IV SOSY
PREFILLED_SYRINGE | INTRAVENOUS | Status: DC | PRN
  Administered 2018-10-15: 20 mg via INTRAVENOUS
  Administered 2018-10-15: 70 mg via INTRAVENOUS

## 2018-10-15 MED ORDER — LACTATED RINGERS IR SOLN
Status: DC | PRN
  Administered 2018-10-15: 1

## 2018-10-15 MED ORDER — ENOXAPARIN SODIUM 40 MG/0.4ML ~~LOC~~ SOLN
40.0000 mg | SUBCUTANEOUS | Status: AC
  Administered 2018-10-15: 10:00:00 40 mg via SUBCUTANEOUS
  Filled 2018-10-15: qty 0.4

## 2018-10-15 MED ORDER — GABAPENTIN 300 MG PO CAPS
300.0000 mg | ORAL_CAPSULE | ORAL | Status: AC
  Administered 2018-10-15: 10:00:00 300 mg via ORAL
  Filled 2018-10-15: qty 1

## 2018-10-15 MED ORDER — PROMETHAZINE HCL 25 MG/ML IJ SOLN: 6.2500 mg | INTRAMUSCULAR | Status: DC | PRN

## 2018-10-15 MED ORDER — MEPERIDINE HCL 50 MG/ML IJ SOLN
INTRAMUSCULAR | Status: AC
  Administered 2018-10-15: 17:00:00 6.25 mg via INTRAVENOUS
  Filled 2018-10-15: qty 1

## 2018-10-15 MED ORDER — FENTANYL CITRATE (PF) 100 MCG/2ML IJ SOLN
25.0000 ug | INTRAMUSCULAR | Status: DC | PRN
  Administered 2018-10-15: 17:00:00 50 ug via INTRAVENOUS

## 2018-10-15 MED ORDER — SCOPOLAMINE 1 MG/3DAYS TD PT72
1.0000 | MEDICATED_PATCH | TRANSDERMAL | Status: DC
  Administered 2018-10-15: 1.5 mg via TRANSDERMAL
  Filled 2018-10-15: qty 1

## 2018-10-15 MED ORDER — PROPOFOL 10 MG/ML IV BOLUS
INTRAVENOUS | Status: AC
  Filled 2018-10-15: qty 20

## 2018-10-15 MED ORDER — FENTANYL CITRATE (PF) 100 MCG/2ML IJ SOLN
INTRAMUSCULAR | Status: AC
  Administered 2018-10-15: 50 ug via INTRAVENOUS
  Filled 2018-10-15: qty 2

## 2018-10-15 MED ORDER — FENTANYL CITRATE (PF) 100 MCG/2ML IJ SOLN
INTRAMUSCULAR | Status: DC | PRN
  Administered 2018-10-15: 100 ug via INTRAVENOUS

## 2018-10-15 MED ORDER — SODIUM CHLORIDE 0.9% FLUSH: 3.0000 mL | Freq: Two times a day (BID) | INTRAVENOUS | Status: DC

## 2018-10-15 MED ORDER — CELECOXIB 200 MG PO CAPS
400.0000 mg | ORAL_CAPSULE | ORAL | Status: AC
  Administered 2018-10-15: 400 mg via ORAL
  Filled 2018-10-15: qty 2

## 2018-10-15 MED ORDER — ACETAMINOPHEN 500 MG PO TABS
1000.0000 mg | ORAL_TABLET | ORAL | Status: AC
  Administered 2018-10-15: 10:00:00 1000 mg via ORAL
  Filled 2018-10-15: qty 2

## 2018-10-15 MED ORDER — DEXAMETHASONE SODIUM PHOSPHATE 10 MG/ML IJ SOLN
INTRAMUSCULAR | Status: AC
  Filled 2018-10-15: qty 1

## 2018-10-15 MED ORDER — ROCURONIUM BROMIDE 10 MG/ML (PF) SYRINGE
PREFILLED_SYRINGE | INTRAVENOUS | Status: AC
  Filled 2018-10-15: qty 10

## 2018-10-15 MED ORDER — MIDAZOLAM HCL 2 MG/2ML IJ SOLN
INTRAMUSCULAR | Status: AC
  Filled 2018-10-15: qty 2

## 2018-10-15 MED ORDER — BUPIVACAINE HCL 0.25 % IJ SOLN
INTRAMUSCULAR | Status: DC | PRN
  Administered 2018-10-15: 30 mL

## 2018-10-15 MED ORDER — PHENYLEPHRINE 40 MCG/ML (10ML) SYRINGE FOR IV PUSH (FOR BLOOD PRESSURE SUPPORT)
PREFILLED_SYRINGE | INTRAVENOUS | Status: DC | PRN
  Administered 2018-10-15 (×2): 40 ug via INTRAVENOUS

## 2018-10-15 MED ORDER — DEXAMETHASONE SODIUM PHOSPHATE 4 MG/ML IJ SOLN
INTRAMUSCULAR | Status: DC | PRN
  Administered 2018-10-15: 10 mg via INTRAVENOUS

## 2018-10-15 MED ORDER — CEFAZOLIN SODIUM-DEXTROSE 2-4 GM/100ML-% IV SOLN
2.0000 g | INTRAVENOUS | Status: AC
  Administered 2018-10-15: 14:00:00 2 g via INTRAVENOUS
  Filled 2018-10-15: qty 100

## 2018-10-15 MED ORDER — SUGAMMADEX SODIUM 500 MG/5ML IV SOLN
INTRAVENOUS | Status: DC | PRN
  Administered 2018-10-15: 400 mg via INTRAVENOUS

## 2018-10-15 MED ORDER — DIPHENHYDRAMINE HCL 50 MG/ML IJ SOLN
INTRAMUSCULAR | Status: DC | PRN
  Administered 2018-10-15: 12.5 mg via INTRAVENOUS

## 2018-10-15 MED ORDER — PROPOFOL 10 MG/ML IV BOLUS
INTRAVENOUS | Status: DC | PRN
  Administered 2018-10-15: 200 mg via INTRAVENOUS

## 2018-10-15 MED ORDER — ONDANSETRON HCL 4 MG/2ML IJ SOLN
INTRAMUSCULAR | Status: DC | PRN
  Administered 2018-10-15: 4 mg via INTRAVENOUS

## 2018-10-15 MED ORDER — FENTANYL CITRATE (PF) 250 MCG/5ML IJ SOLN
INTRAMUSCULAR | Status: AC
  Filled 2018-10-15: qty 5

## 2018-10-15 SURGICAL SUPPLY — 58 items
ADH SKN CLS APL DERMABOND .7 (GAUZE/BANDAGES/DRESSINGS) ×1
AGENT HMST KT MTR STRL THRMB (HEMOSTASIS)
APL ESCP 34 STRL LF DISP (HEMOSTASIS)
APPLICATOR SURGIFLO ENDO (HEMOSTASIS) IMPLANT
BAG LAPAROSCOPIC 12 15 PORT 16 (BASKET) IMPLANT
BAG RETRIEVAL 12/15 (BASKET)
BAG SPEC RTRVL LRG 6X4 10 (ENDOMECHANICALS)
COVER BACK TABLE 60X90IN (DRAPES) ×2 IMPLANT
COVER TIP SHEARS 8 DVNC (MISCELLANEOUS) ×1 IMPLANT
COVER TIP SHEARS 8MM DA VINCI (MISCELLANEOUS) ×1
COVER WAND RF STERILE (DRAPES) IMPLANT
DECANTER SPIKE VIAL GLASS SM (MISCELLANEOUS) IMPLANT
DERMABOND ADVANCED (GAUZE/BANDAGES/DRESSINGS) ×1
DERMABOND ADVANCED .7 DNX12 (GAUZE/BANDAGES/DRESSINGS) ×1 IMPLANT
DRAIN CHANNEL RND F F (WOUND CARE) IMPLANT
DRAPE ARM DVNC X/XI (DISPOSABLE) ×4 IMPLANT
DRAPE COLUMN DVNC XI (DISPOSABLE) ×1 IMPLANT
DRAPE DA VINCI XI ARM (DISPOSABLE) ×4
DRAPE DA VINCI XI COLUMN (DISPOSABLE) ×1
DRAPE SHEET LG 3/4 BI-LAMINATE (DRAPES) ×2 IMPLANT
DRAPE SURG IRRIG POUCH 19X23 (DRAPES) ×2 IMPLANT
ELECT REM PT RETURN 15FT ADLT (MISCELLANEOUS) ×2 IMPLANT
GAUZE 4X4 16PLY RFD (DISPOSABLE) IMPLANT
GLOVE BIO SURGEON STRL SZ 6 (GLOVE) ×8 IMPLANT
GLOVE BIO SURGEON STRL SZ 6.5 (GLOVE) IMPLANT
GOWN STRL REUS W/ TWL LRG LVL3 (GOWN DISPOSABLE) ×2 IMPLANT
GOWN STRL REUS W/TWL LRG LVL3 (GOWN DISPOSABLE) ×4
HOLDER FOLEY CATH W/STRAP (MISCELLANEOUS) ×2 IMPLANT
IRRIG SUCT STRYKERFLOW 2 WTIP (MISCELLANEOUS) ×2
IRRIGATION SUCT STRKRFLW 2 WTP (MISCELLANEOUS) ×1 IMPLANT
KIT PROCEDURE DA VINCI SI (MISCELLANEOUS)
KIT PROCEDURE DVNC SI (MISCELLANEOUS) IMPLANT
KIT TURNOVER KIT A (KITS) IMPLANT
MANIPULATOR UTERINE 4.5 ZUMI (MISCELLANEOUS) ×2 IMPLANT
NDL SPNL 18GX3.5 QUINCKE PK (NEEDLE) IMPLANT
NEEDLE HYPO 22GX1.5 SAFETY (NEEDLE) IMPLANT
NEEDLE SPNL 18GX3.5 QUINCKE PK (NEEDLE) IMPLANT
OBTURATOR OPTICAL STANDARD 8MM (TROCAR) ×1
OBTURATOR OPTICAL STND 8 DVNC (TROCAR) ×1
OBTURATOR OPTICALSTD 8 DVNC (TROCAR) ×1 IMPLANT
PACK ROBOT GYN CUSTOM WL (TRAY / TRAY PROCEDURE) ×2 IMPLANT
PAD POSITIONING PINK XL (MISCELLANEOUS) ×2 IMPLANT
PORT ACCESS TROCAR AIRSEAL 12 (TROCAR) ×1 IMPLANT
PORT ACCESS TROCAR AIRSEAL 5M (TROCAR) ×1
POUCH SPECIMEN RETRIEVAL 10MM (ENDOMECHANICALS) IMPLANT
SEAL CANN UNIV 5-8 DVNC XI (MISCELLANEOUS) ×3 IMPLANT
SEAL XI 5MM-8MM UNIVERSAL (MISCELLANEOUS) ×3
SET TRI-LUMEN FLTR TB AIRSEAL (TUBING) ×2 IMPLANT
SURGIFLO W/THROMBIN 8M KIT (HEMOSTASIS) IMPLANT
SUT VIC AB 0 CT1 27 (SUTURE) ×2
SUT VIC AB 0 CT1 27XBRD ANTBC (SUTURE) IMPLANT
SUT VIC AB 4-0 PS2 18 (SUTURE) ×4 IMPLANT
SYR 10ML LL (SYRINGE) IMPLANT
TOWEL OR NON WOVEN STRL DISP B (DISPOSABLE) ×2 IMPLANT
TRAP SPECIMEN MUCOUS 40CC (MISCELLANEOUS) IMPLANT
TRAY FOLEY MTR SLVR 16FR STAT (SET/KITS/TRAYS/PACK) ×2 IMPLANT
UNDERPAD 30X30 (UNDERPADS AND DIAPERS) ×2 IMPLANT
WATER STERILE IRR 1000ML POUR (IV SOLUTION) ×2 IMPLANT

## 2018-10-15 NOTE — Anesthesia Preprocedure Evaluation (Addendum)
Anesthesia Evaluation  Patient identified by MRN, date of birth, ID band Patient awake    Reviewed: Allergy & Precautions, NPO status , Patient's Chart, lab work & pertinent test results  History of Anesthesia Complications Negative for: history of anesthetic complications  Airway Mallampati: II  TM Distance: >3 FB Neck ROM: Full    Dental no notable dental hx. (+) Dental Advisory Given   Pulmonary asthma ,    Pulmonary exam normal        Cardiovascular hypertension, Normal cardiovascular exam     Neuro/Psych negative neurological ROS  negative psych ROS   GI/Hepatic Neg liver ROS, GERD  ,  Endo/Other  negative endocrine ROSdiabetes  Renal/GU negative Renal ROS  negative genitourinary   Musculoskeletal negative musculoskeletal ROS (+)   Abdominal   Peds negative pediatric ROS (+)  Hematology negative hematology ROS (+)   Anesthesia Other Findings   Reproductive/Obstetrics negative OB ROS                            Anesthesia Physical Anesthesia Plan  ASA: III  Anesthesia Plan: General   Post-op Pain Management:    Induction: Intravenous  PONV Risk Score and Plan: 4 or greater and Ondansetron, Dexamethasone, Scopolamine patch - Pre-op and Diphenhydramine  Airway Management Planned: Oral ETT  Additional Equipment:   Intra-op Plan:   Post-operative Plan: Extubation in OR  Informed Consent: I have reviewed the patients History and Physical, chart, labs and discussed the procedure including the risks, benefits and alternatives for the proposed anesthesia with the patient or authorized representative who has indicated his/her understanding and acceptance.     Dental advisory given  Plan Discussed with: CRNA and Anesthesiologist  Anesthesia Plan Comments:         Anesthesia Quick Evaluation

## 2018-10-15 NOTE — Op Note (Signed)
OPERATIVE NOTE 10/14/28  Surgeon: Donaciano Eva   Assistants: Dr Lahoma Crocker (an MD assistant was necessary for tissue manipulation, management of robotic instrumentation, retraction and positioning due to the complexity of the case and hospital policies).   Anesthesia: General endotracheal anesthesia  ASA Class: 3   Pre-operative Diagnosis: postmenopausal bleeding, symptomatic uterine fibroids, bilateral ovarian cysts  Post-operative Diagnosis: same, benign endometrium and ovaries  Operation: Robotic-assisted laparoscopic total hysterectomy for a uterus >250gm with bilateral salpingoophorectomy  Surgeon: Donaciano Eva  Assistant Surgeon: Lahoma Crocker MD  Anesthesia: GET  Urine Output: 30  Operative Findings:  : 14cm bulky fibroid uterus, bilateral ovarian cysts (right 6cm, left 4cm), grossly normal peritoneum, appendix, no ascites.  Estimated Blood Loss:  less than 50 mL      Total IV Fluids: 800 ml         Specimens: uterus, cervix, bilateral tubes and ovaries, washings         Complications:  None; patient tolerated the procedure well.         Disposition: PACU - hemodynamically stable.  Procedure Details  The patient was seen in the Holding Room. The risks, benefits, complications, treatment options, and expected outcomes were discussed with the patient.  The patient concurred with the proposed plan, giving informed consent.  The site of surgery properly noted/marked. The patient was identified as Rose Scott and the procedure verified as a Robotic-assisted hysterectomy with bilateral salpingo oophorectomy with possible staging. A Time Out was held and the above information confirmed.  After induction of anesthesia, the patient was draped and prepped in the usual sterile manner. Pt was placed in supine position after anesthesia and draped and prepped in the usual sterile manner. The abdominal drape was placed after the CholoraPrep had been  allowed to dry for 3 minutes.  Her arms were tucked to her side with all appropriate precautions.  The shoulders were stabilized with padded shoulder blocks applied to the acromium processes.  The patient was placed in the semi-lithotomy position in [place].  The perineum was prepped with Betadine. The patient was then prepped. Foley catheter was placed.  A sterile speculum was placed in the vagina.  The cervix was grasped with a single-tooth tenaculum. The cervix was dilated with Kennon Rounds dilators.  The ZUMI uterine manipulator with a medium colpotomizer ring was placed without difficulty.  A pneum occluder balloon was placed over the manipulator.  OG tube placement was confirmed and to suction.   Next, a 5 mm skin incision was made 1 cm below the subcostal margin in the midclavicular line.  The 5 mm Optiview port and scope was used for direct entry.  Opening pressure was under 10 mm CO2.  The abdomen was insufflated and the findings were noted as above.   At this point and all points during the procedure, the patient's intra-abdominal pressure did not exceed 15 mmHg. Next, a 10 mm skin incision was made in the umbilicus and a right and left port was placed about 10 cm lateral to the robot port on the right and left side.  A fourth arm was placed in the left lower quadrant 2 cm above and superior and medial to the anterior superior iliac spine.  All ports were placed under direct visualization.  The patient was placed in steep Trendelenburg.  Bowel was folded away into the upper abdomen.  The robot was docked in the normal manner.  The hysterectomy was started after the round ligament on the  right side was incised and the retroperitoneum was entered and the pararectal space was developed.  The ureter was noted to be on the medial leaf of the broad ligament.  The peritoneum above the ureter was incised and stretched and the infundibulopelvic ligament was skeletonized, cauterized and cut.  The posterior  peritoneum was taken down to the level of the KOH ring.  The anterior peritoneum was also taken down.  The bladder flap was created to the level of the KOH ring.  The uterine artery on the right side was skeletonized, cauterized and cut in the normal manner.  A similar procedure was performed on the left.  The colpotomy was made and the uterus, cervix, bilateral ovaries and tubes were amputated and delivered through the vagina.  Pedicles were inspected and excellent hemostasis was achieved.    The colpotomy at the vaginal cuff was closed with Vicryl on a CT1 needle in a running manner. Additional figure of 8 sutures were placed in the cuff where the sutures appeared slightly lax. Irrigation was used and excellent hemostasis was achieved.  At this point in the procedure was completed.  Robotic instruments were removed under direct visulaization.  The robot was undocked. The 10 mm ports were closed with Vicryl on a UR-5 needle and the fascia was closed with 0 Vicryl on a UR-5 needle.  The skin was closed with 4-0 Vicryl in a subcuticular manner.  Dermabond was applied.  Sponge, lap and needle counts correct x 2.  Frozen section returned benign at the endometrium and ovaries.  The vagina was swabbed with  minimal bleeding noted.   All instrument and needle counts were correct x  3.   The patient was transferred to the recovery room in a stable condition.  Donaciano Eva, MD

## 2018-10-15 NOTE — Anesthesia Postprocedure Evaluation (Signed)
Anesthesia Post Note  Patient: Rose Scott  Procedure(s) Performed: XI ROBOTIC ASSISTED TOTAL HYSTERECTOMY WITH BILATERAL SALPINGO OOPHORECTOMY (Bilateral )     Patient location during evaluation: PACU Anesthesia Type: General Level of consciousness: sedated Pain management: pain level controlled Vital Signs Assessment: post-procedure vital signs reviewed and stable Respiratory status: spontaneous breathing and respiratory function stable Cardiovascular status: stable Postop Assessment: no apparent nausea or vomiting Anesthetic complications: no    Last Vitals:  Vitals:   10/15/18 1730 10/15/18 1810  BP: (!) 147/82 (!) 154/83  Pulse: 71 77  Resp: 18 14  Temp:  36.8 C  SpO2: 100% 94%    Last Pain:  Vitals:   10/15/18 1810  PainSc: 3                  Drayke Grabel DANIEL

## 2018-10-15 NOTE — Transfer of Care (Signed)
Immediate Anesthesia Transfer of Care Note  Patient: Naiara Lombardozzi  Procedure(s) Performed: Procedure(s): XI ROBOTIC ASSISTED TOTAL HYSTERECTOMY WITH BILATERAL SALPINGO OOPHORECTOMY (Bilateral)  Patient Location: PACU  Anesthesia Type:General  Level of Consciousness: Patient easily awoken, sedated, comfortable, cooperative, following commands, responds to stimulation.   Airway & Oxygen Therapy: Patient spontaneously breathing, ventilating well, oxygen via simple oxygen mask.  Post-op Assessment: Report given to PACU RN, vital signs reviewed and stable, moving all extremities.   Post vital signs: Reviewed and stable.  Complications: No apparent anesthesia complications Last Vitals:  Vitals Value Taken Time  BP    Temp    Pulse 70 10/15/18 1557  Resp 14 10/15/18 1557  SpO2 100 % 10/15/18 1557  Vitals shown include unvalidated device data.  Last Pain:  Vitals:   10/15/18 1011  PainSc: 0-No pain         Complications: No apparent anesthesia complications

## 2018-10-15 NOTE — Discharge Instructions (Signed)
10/15/2018  Return to work: 4 weeks  Activity: 1. Be up and out of the bed during the day.  Take a nap if needed.  You may walk up steps but be careful and use the hand rail.  Stair climbing will tire you more than you think, you may need to stop part way and rest.   2. No lifting or straining for 4 weeks.  3. No driving for 1 weeks.  Do Not drive if you are taking narcotic pain medicine.  4. Shower daily.  Use soap and water on your incision and pat dry; don't rub.   5. No sexual activity and nothing in the vagina for 8 weeks.  Medications:  - Take ibuprofen and tylenol first line for pain control. Take these regularly (every 6 hours) to decrease the build up of pain.  - If necessary, for severe pain not relieved by ibuprofen, take percocet.  - While taking percocet you should take sennakot every night to reduce the likelihood of constipation. If this causes diarrhea, stop its use.  Diet: 1. Low sodium Heart Healthy Diet is recommended.  2. It is safe to use a laxative if you have difficulty moving your bowels.   Wound Care: 1. Keep clean and dry.  Shower daily.  Reasons to call the Doctor:   Fever - Oral temperature greater than 100.4 degrees Fahrenheit  Foul-smelling vaginal discharge  Difficulty urinating  Nausea and vomiting  Increased pain at the site of the incision that is unrelieved with pain medicine.  Difficulty breathing with or without chest pain  New calf pain especially if only on one side  Sudden, continuing increased vaginal bleeding with or without clots.   Follow-up: 1. See Everitt Amber in 3 weeks.  Contacts: For questions or concerns you should contact:  Dr. Everitt Amber at [phone] After hours and on week-ends call [phone] and ask to speak to the physician on call for Gynecologic Oncology  After Your Surgery  The information in this section will tell you what to expect after your surgery, both during your stay and after you leave.  You will learn how to safely recover from your surgery. Write down any questions you have and be sure to ask your doctor or nurse. What to Expect When you wake up after your surgery, you will be in the [facility] (PACU) or your recovery room. A nurse will be monitoring your body temperature, blood pressure, pulse, and oxygen levels. You may have a urinary catheter in your bladder to help monitor the amount of urine you are making. It should come out before you go home. You will also have compression boots on your lower legs to help your circulation. Your pain medication will be given through an IV line or in tablet form. If you are having pain, tell your nurse. Your nurse will tell you how to recover from your surgery. Below are examples of ways you can help yourself recover safely.  You will be encouraged to walk with the help of your nurse or physical therapist. We will give you medication to relieve pain. Walking helps reduce the risk for blood clots and pneumonia. It also helps to stimulate your bowels so they begin working again.  Use your incentive spirometer. This will help your lungs expand, which prevents pneumonia. For more information, read How to Use Your Incentive Spirometer. Commonly Asked Questions Will I have pain after surgery? Yes, you will have some pain after your surgery, especially  in the first few days. Your doctor and nurse will ask you about your pain often. You will be given medication to manage your pain as needed. If your pain is not relieved, please tell your doctor or nurse. It is important to control your pain so you can cough, breathe deeply, use your incentive spirometer, and get out of bed and walk. Will I be able to eat? Yes, you will be able to eat a regular diet or eat as tolerated. You should start with foods that are soft and easy to digest such as apple sauce and chicken noodle soup. Eat small meals frequently, and then advance to regular  foods. If you experience bloating, gas, or cramps, limit high-fiber foods, including whole grain breads and cereal, nuts, seeds, salads, fresh fruit, broccoli, cabbage, and cauliflower. Will I have pain when I am home? The length of time each person has pain or discomfort varies. You may still have some pain when you go home and will probably be taking pain medication. Follow the guidelines below.  Take your medications as directed and as needed.  Call your doctor if the medication prescribed for you doesn't relieve your pain.  Don't drive or drink alcohol while you're taking prescription pain medication.  As your incision heals, you will have less pain and need less pain medication. A mild pain reliever such as acetaminophen (Tylenol) or ibuprofen (Advil) will relieve aches and discomfort. However, large quantities of acetaminophen may be harmful to your liver. Don't take more acetaminophen than the amount directed on the bottle or as instructed by your doctor or nurse.  Pain medication should help you as you resume your normal activities. Take enough medication to do your exercises comfortably. Pain medication is most effective 30 to 45 minutes after taking it.  Keep track of when you take your pain medication. Taking it when your pain first begins is more effective than waiting for the pain to get worse. Pain medication may cause constipation (having fewer bowel movements than what is normal for you). How can I prevent constipation?  Go to the bathroom at the same time every day. Your body will get used to going at that time.  If you feel the urge to go, don't put it off. Try to use the bathroom 5 to 15 minutes after meals.  After breakfast is a good time to move your bowels. The reflexes in your colon are strongest at this time.  Exercise, if you can. Walking is an excellent form of exercise.  Drink 8 (8-ounce) glasses (2 liters) of liquids daily, if you can. Drink water, juices,  soups, ice cream shakes, and other drinks that don't have caffeine. Drinks with caffeine, such as coffee and soda, pull fluid out of the body.  Slowly increase the fiber in your diet to 25 to 35 grams per day. Fruits, vegetables, whole grains, and cereals contain fiber. If you have an ostomy or have had recent bowel surgery, check with your doctor or nurse before making any changes in your diet.  Both over-the-counter and prescription medications are available to treat constipation. Start with 1 of the following over-the-counter medications first: o Docusate sodium (Colace) 100 mg. Take ___1__ capsules _2____ times a day. This is a stool softener that causes few side effects. Don't take it with mineral oil. o Polyethylene glycol (MiraLAX) 17 grams daily. o Senna (Senokot) 2 tablets at bedtime. This is a stimulant laxative, which can cause cramping.  If you haven't had a bowel  movement in 2 days, call your doctor or nurse. Can I shower? Yes, you should shower 24 hours after your surgery. Be sure to shower every day. Taking a warm shower is relaxing and can help decrease muscle aches. Use soap when you shower and gently wash your incision. Pat the areas dry with a towel after showering, and leave your incision uncovered (unless there is drainage). Call your doctor if you see any redness or drainage from your incision. Don't take tub baths until you discuss it with your doctor at the first appointment after your surgery. How do I care for my incisions? You will have several small incisions on your abdomen. The incisions are closed with Steri-Strips or Dermabond. You may also have square white dressings on your incisions (Primapore). You can remove these in the shower 24 hours after your surgery. You should clean your incisions with soap and water. If you go home with Steri-Strips on your incision, they will loosen and may fall off by themselves. If they haven't fallen off within 10 days, you can  remove them. If you go home with Dermabond over your sutures (stitches), it will also loosen and peel off. What are the most common symptoms after a hysterectomy? It's common for you to have some vaginal spotting or light bleeding. You should monitor this with a pad or a panty liner. If you have having heavy bleeding (bleeding through a pad or liner every 1 to 2 hours), call your doctor right away. It's also common to have some discomfort after surgery from the air that was pumped into your abdomen during surgery. To help with this, walk, drink plenty of liquids and make sure to take the stool softeners you received. When is it safe for me to drive? You may resume driving 2 weeks after surgery, as long as you are not taking pain medication that may make you drowsy. When can I resume sexual activity? Do not place anything in your vagina or have vaginal intercourse for 8 weeks after your surgery. Some people will need to wait longer than 8 weeks, so speak with your doctor before resuming sexual intercourse. Will I be able to travel? Yes, you can travel. If you are traveling by plane within a few weeks after your surgery, make sure you get up and walk every hour. Be sure to stretch your legs, drink plenty of liquids, and keep your feet elevated when possible. Will I need any supplies? Most people do not need any supplies after the surgery. In the rare case that you do need supplies, such as tubes or drains, your nurse will order them for you. When can I return to work? The time it takes to return to work depends on the type of work you do, the type of surgery you had, and how fast your body heals. Most people can return to work about 2 to 4 weeks after the surgery. What exercises can I do? Exercise will help you gain strength and feel better. Walking and stair climbing are excellent forms of exercise. Gradually increase the distance you walk. Climb stairs slowly, resting or stopping as needed. Ask your  doctor or nurse before starting more strenuous exercises. When can I lift heavy objects? Most people should not lift anything heavier than 10 pounds (4.5 kilograms) for at least 4 weeks after surgery. Speak with your doctor about when you can do heavy lifting. How can I cope with my feelings? After surgery for a serious illness, you may have  new and upsetting feelings. Many people say they felt weepy, sad, worried, nervous, irritable, and angry at one time or another. You may find that you can't control some of these feelings. If this happens, it's a good idea to seek emotional support. The first step in coping is to talk about how you feel. Family and friends can help. Your nurse, doctor, and social worker can reassure, support, and guide you. It's always a good idea to let these professionals know how you, your family, and your friends are feeling emotionally. Many resources are available to patients and their families. Whether you're in the hospital or at home, the nurses, doctors, and social workers are here to help you and your family and friends handle the emotional aspects of your illness. When is my first appointment after surgery? Your first appointment after surgery will be 2 to 4 weeks after surgery. Your nurse will give you instructions on how to make this appointment, including the phone number to call. What if I have other questions? If you have any questions or concerns, please talk with your doctor or nurse. You can reach them Monday through Friday from 9:00 am to 5:00 pm. After 5:00 pm, during the weekend, and on holidays, call [phone] and ask for the doctor on call for your doctor.   Have a temperature of 101 F (38.3 C) or higher  Have pain that does not get better with pain medication  Have redness, drainage, or swelling from your incisions

## 2018-10-15 NOTE — Interval H&P Note (Signed)
History and Physical Interval Note:  10/15/2018 1:28 PM  Rose Scott  has presented today for surgery, with the diagnosis of BILATERAL OVARIAN CYST,POSTMENOPAUSAL BLEEDING,FIBROIDS.  The various methods of treatment have been discussed with the patient and family. After consideration of risks, benefits and other options for treatment, the patient has consented to  Procedure(s): XI ROBOTIC ASSISTED TOTAL HYSTERECTOMY WITH BILATERAL SALPINGO OOPHORECTOMY AND POSSIBLE STAGING,POSSIBLE MINI LAPAROTOMY (Bilateral) as a surgical intervention.  The patient's history has been reviewed, patient examined, no change in status, stable for surgery.  I have reviewed the patient's chart and labs.  Questions were answered to the patient's satisfaction.     Thereasa Solo

## 2018-10-15 NOTE — Anesthesia Procedure Notes (Signed)
Procedure Name: Intubation Date/Time: 10/15/2018 1:38 PM Performed by: Deliah Boston, CRNA Pre-anesthesia Checklist: Patient identified, Emergency Drugs available, Suction available and Patient being monitored Patient Re-evaluated:Patient Re-evaluated prior to induction Oxygen Delivery Method: Circle system utilized Preoxygenation: Pre-oxygenation with 100% oxygen Induction Type: IV induction Ventilation: Mask ventilation without difficulty Laryngoscope Size: Mac and 4 Grade View: Grade I Tube type: Oral Number of attempts: 1 Airway Equipment and Method: Stylet and Oral airway Placement Confirmation: ETT inserted through vocal cords under direct vision,  positive ETCO2 and breath sounds checked- equal and bilateral Secured at: 22 cm Tube secured with: Tape Dental Injury: Teeth and Oropharynx as per pre-operative assessment

## 2018-10-16 ENCOUNTER — Encounter (HOSPITAL_COMMUNITY): Payer: Self-pay | Admitting: Gynecologic Oncology

## 2018-10-16 ENCOUNTER — Telehealth: Payer: Self-pay

## 2018-10-16 NOTE — Telephone Encounter (Signed)
Pt doing well from surgery. T=99.6.  Call if 100.4 or higher. She is only needed the Ibuprofen ~every 8 hrs.  She has been up and moving around today. Eating and drinking well. Urinating well Her throat is irritated from the endotracheal tube.  Suggested that she gargle with salt water 2-3 times a day to help throat. Incisions D&I. She knows to call the office at [phone] if she has any questions or concerns.

## 2018-10-20 ENCOUNTER — Telehealth: Payer: Self-pay

## 2018-10-20 NOTE — Telephone Encounter (Signed)
Told Rose Scott that her final surgical pathology was all benign per Joylene John, NP.

## 2018-11-11 ENCOUNTER — Inpatient Hospital Stay: Attending: Gynecologic Oncology | Admitting: Gynecologic Oncology

## 2018-11-11 ENCOUNTER — Encounter: Payer: Self-pay | Admitting: Gynecologic Oncology

## 2018-11-11 ENCOUNTER — Other Ambulatory Visit: Payer: Self-pay

## 2018-11-11 VITALS — BP 147/79 | HR 66 | Temp 98.3°F | Resp 18 | Ht 64.0 in | Wt 205.0 lb

## 2018-11-11 DIAGNOSIS — Z90722 Acquired absence of ovaries, bilateral: Secondary | ICD-10-CM | POA: Insufficient documentation

## 2018-11-11 DIAGNOSIS — D250 Submucous leiomyoma of uterus: Secondary | ICD-10-CM | POA: Diagnosis not present

## 2018-11-11 DIAGNOSIS — D251 Intramural leiomyoma of uterus: Secondary | ICD-10-CM | POA: Insufficient documentation

## 2018-11-11 DIAGNOSIS — N950 Postmenopausal bleeding: Secondary | ICD-10-CM

## 2018-11-11 DIAGNOSIS — Z90710 Acquired absence of both cervix and uterus: Secondary | ICD-10-CM | POA: Diagnosis not present

## 2018-11-11 DIAGNOSIS — N83202 Unspecified ovarian cyst, left side: Secondary | ICD-10-CM | POA: Insufficient documentation

## 2018-11-11 DIAGNOSIS — N83201 Unspecified ovarian cyst, right side: Secondary | ICD-10-CM | POA: Insufficient documentation

## 2018-11-11 NOTE — Progress Notes (Signed)
Follow-up Note: Gyn-Onc  Consult was requested by Dr. Jodi Mourning for the evaluation of Rose Scott 53 y.o. female  CC:  Chief Complaint  Patient presents with  . Fibroids    postop  . right ovarian cyst    Assessment/Plan:  Rose Scott  is a 53 y.o.  year old with a history of bilateral ovarian cysts and postmenopausal bleeding. S/p robotic hysterectomy for a uterus >250gm with BSO. Benign pathology (fibroids and serous cystadenoma).   She is doing well postop.  No complaints.   She will follow-up with Dr Jodi Mourning postop.    HPI: Rose Scott is a 53 year old P2 who is seen in consultation at the request of Dr Jodi Mourning for postmenopausal bleeding and bilateral ovarian cysts. The patient has a history of postmenopausal bleeding since 2018 approximately. 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  The endometrium was 9 mm.  The right ovary contained a simple appearing cyst that measured 6.1 x 4.5 x 5.1 cm.  The left ovary contained a complex cystic mass object adjacent to the left ovary which is unclear if this is paraovarian and measured 6.8 x 5.4 x 4.4 cm.  Ca1 25 was drawn on July 10, 2018 which showed normal value of 5.1.   Due to the thickened endometrium on ultrasound a endometrial biopsy was performed for at least attempted on July 09, 2018 and this was revealing only for unremarkable endocervical glandular tissue but no endometrial tissue present.  A Pap smear to be performed on July 03, 2018 and was negative for dysplasia, negative for high-risk HPV.  The patient's medical history significant for obesity with a BMI of 33 kg/m.  She is had 2 prior vaginal deliveries.  She has  prediabetes with a hemoglobin A1c in March 2020 at 6.7%.  Her surgical history significant for tubal ligation and an endometrial ablation which was performed in 2011.  She has experienced menopausal symptoms and believes she is postmenopausal.  She has never smoked.  She takes metformin for her diabetes.  Interval Hx:   On October 15, 2018 she underwent robotic assisted total hysterectomy for uterus greater than 250 g with bilateral salpingo-oophorectomy.  Intraoperative findings were significant for a 14 cm bulky fibroid uterus, bilateral ovarian cysts 6 cm in the right and left 4 cm.  Frozen section was benign.  Definitive pathology showed benign uterine fibroids and benign ovarian cystadenomas bilaterally.  The patient did well postoperatively with no complications.  Current Meds:  Outpatient Encounter Medications as of 11/11/2018  Medication Sig  . albuterol (PROVENTIL HFA;VENTOLIN HFA) 108 (90 Base) MCG/ACT inhaler Inhale 2 puffs into the lungs 4 (four) times daily as needed for wheezing or shortness of breath.   Marland Kitchen aspirin EC 81 MG tablet Take 81 mg by mouth daily.  . Cholecalciferol (VITAMIN D3) 10 MCG (400 UNIT) CAPS Take 400 Units by mouth daily as needed (low d3 labs).  . Ferrous Sulfate (IRON PO) Take 1 tablet by mouth daily as needed (low iron per labs).  Marland Kitchen ibuprofen (ADVIL) 600 MG tablet Take 1 tablet (600 mg total) by mouth every 6 (six) hours as needed. For AFTER surgery  .  Infant Care Products (BABY SHAMPOO EX) Apply 1 application to eye daily. Cleanse eyelids daily  . loratadine (CLARITIN) 10 MG tablet Take 10 mg by mouth daily as needed (allergies.).   Marland Kitchen losartan-hydrochlorothiazide (HYZAAR) 50-12.5 MG tablet Take 1 tablet by mouth every evening.   . magnesium oxide (MAG-OX) 400 MG tablet Take 400 mg by mouth daily as needed (low magnesium per labs.).  Marland Kitchen metFORMIN (GLUCOPHAGE-XR) 500 MG 24 hr tablet Take 500 mg by mouth at bedtime.   Marland Kitchen oxyCODONE (OXY IR/ROXICODONE) 5 MG immediate  release tablet Take 1 tablet (5 mg total) by mouth every 4 (four) hours as needed for severe pain. For AFTER surgery, do not take and drive  . pantoprazole (PROTONIX) 40 MG tablet Take 40 mg by mouth daily before breakfast.  . Polyethyl Glycol-Propyl Glycol (SYSTANE) 0.4-0.3 % SOLN Place 1 drop into both eyes 2 (two) times daily as needed (dry eyes.).  Marland Kitchen senna-docusate (SENOKOT-S) 8.6-50 MG tablet Take 2 tablets by mouth at bedtime. For AFTER surgery, do not take if having diarrhea  . vitamin C (ASCORBIC ACID) 500 MG tablet Take 500 mg by mouth daily as needed (low c per labs).   No facility-administered encounter medications on file as of 11/11/2018.     Allergy:  Allergies  Allergen Reactions  . Multihance [Gadobenate] Nausea And Vomiting, Other (See Comments) and Cough    Pt became nauseous and started vomiting a couple of minutes after 19 mL Multihance given. Pt stated that her throat felt tight and like fluid was stuck in her throat. Pt's eyes were red. Dr. Joneen Caraway assessed pt and gave pt benedryl. Pt began to feel better after vomiting and benedryl given.   . Tape Rash    Needs paper tape    Social Hx:   Social History   Socioeconomic History  . Marital status: Married    Spouse name: Not on file  . Number of children: 2  . Years of education: Not on file  . Highest education level: Bachelor's degree (e.g., BA, AB, BS)  Occupational History  . Not on file  Social Needs  . Financial resource strain: Not on file  . Food insecurity    Worry: Not on file    Inability: Not on file  . Transportation needs    Medical: Not on file    Non-medical: Not on file  Tobacco Use  . Smoking status: Never Smoker  . Smokeless tobacco: Never Used  Substance and Sexual Activity  . Alcohol use: Yes    Comment: rare occ 1/2 glass of red wine  . Drug use: No  . Sexual activity: Yes    Partners: Male    Birth control/protection: None, Surgical  Lifestyle  . Physical activity    Days per  week: Not on file    Minutes per session: Not on file  . Stress: Not on file  Relationships  . Social Herbalist on phone: Not on file    Gets together: Not on file    Attends religious service: Not on file    Active member of club or organization: Not on file    Attends meetings of clubs or organizations: Not on file    Relationship status: Not on file  . Intimate partner violence    Fear of current or ex partner: Not on file    Emotionally abused: Not on file    Physically abused: Not on file    Forced sexual activity: Not  on file  Other Topics Concern  . Not on file  Social History Narrative   Lives at home with her spouse   Right handed   Drinks 2+ cups of caffeine daily    Past Surgical Hx:  Past Surgical History:  Procedure Laterality Date  . ABLATION  2011   endometrial  . colonoscopy     . ROBOTIC ASSISTED TOTAL HYSTERECTOMY WITH BILATERAL SALPINGO OOPHERECTOMY Bilateral 10/15/2018   Procedure: XI ROBOTIC ASSISTED TOTAL HYSTERECTOMY WITH BILATERAL SALPINGO OOPHORECTOMY;  Surgeon: Everitt Amber, MD;  Location: [facility];  Service: Gynecology;  Laterality: Bilateral;  . TUBAL LIGATION  1995  . UPPER GI ENDOSCOPY      Past Medical Hx:  Past Medical History:  Diagnosis Date  . Anemia   . Asthma   . Diabetes mellitus without complication ([place]) 62/37/6283   per Arrie Eastern, [place] office note; type 2 on metformins , does not regulary check  blood sugar at home   . Environmental allergies   . GERD (gastroesophageal reflux disease)   . Hypertension     Past Gynecological History:  P2, SVD x 2. No LMP recorded. Patient has had an ablation.  Family Hx:  Family History  Problem Relation Age of Onset  . Diabetes Mother   . Breast cancer Mother        in 32's  . COPD Father   . Congestive Heart Failure Father   . Diabetes Maternal Grandmother   . Hypertension Maternal Grandmother   . Diabetes Maternal Grandfather   . Diabetes Paternal Grandmother   . Breast  cancer Maternal Aunt   . Seizures Neg Hx     Review of Systems:  Constitutional  Feels well,    ENT Normal appearing ears and nares bilaterally Skin/Breast  No rash, sores, jaundice, itching, dryness Cardiovascular  No chest pain, shortness of breath, or edema  Pulmonary  No cough or wheeze.  Gastro Intestinal  No nausea, vomitting, or diarrhoea. No bright red blood per rectum, no abdominal pain, change in bowel movement, or constipation. Occasional bilateral pelvic pains. Genito Urinary  No frequency, urgency, dysuria, no bleeding Musculo Skeletal  No myalgia, arthralgia, joint swelling or pain  Neurologic  No weakness, numbness, change in gait,  Psychology  No depression, anxiety, insomnia.   Vitals:  Blood pressure (!) 147/79, pulse 66, temperature 98.3 F (36.8 C), temperature source Oral, resp. rate 18, height 5\' 4"  (1.626 m), weight 205 lb (93 kg), SpO2 100 %.  Physical Exam: WD in NAD Neck  Supple NROM, without any enlargements.  Lymph Node Survey No cervical supraclavicular or inguinal adenopathy Cardiovascular  Pulse normal rate, regularity and rhythm. S1 and S2 normal.  Lungs  Clear to auscultation bilateraly, without wheezes/crackles/rhonchi. Good air movement.  Skin  No rash/lesions/breakdown  Psychiatry  Alert and oriented to person, place, and time  Abdomen  Normoactive bowel sounds, abdomen soft, non-tender and obese without evidence of hernia. Incisions well healed.  Back No CVA tenderness Genito Urinary  Vaginal cuff healing normally.  Rectal  deferred Extremities  No bilateral cyanosis, clubbing or edema.   Thereasa Solo, MD  11/11/2018, 3:15 PM

## 2018-11-11 NOTE — Patient Instructions (Signed)
Rose Scott is clearing you to return to work on July 27th, 2020.  Dr Denman Toruno recommends no intercourse for an additional 5 weeks.   No cancer was found in your hysterectomy specimen.

## 2019-07-20 ENCOUNTER — Other Ambulatory Visit: Payer: Self-pay | Admitting: Physician Assistant

## 2019-07-20 DIAGNOSIS — Z1231 Encounter for screening mammogram for malignant neoplasm of breast: Secondary | ICD-10-CM

## 2019-07-22 ENCOUNTER — Ambulatory Visit
Admission: RE | Admit: 2019-07-22 | Discharge: 2019-07-22 | Disposition: A | Discharge status: Home / Self Care | Source: Ambulatory Visit | Attending: Physician Assistant | Admitting: Physician Assistant

## 2019-07-22 ENCOUNTER — Other Ambulatory Visit: Payer: Self-pay

## 2019-07-22 DIAGNOSIS — Z1231 Encounter for screening mammogram for malignant neoplasm of breast: Secondary | ICD-10-CM

## 2020-03-17 ENCOUNTER — Encounter (HOSPITAL_COMMUNITY): Payer: Self-pay

## 2020-03-17 ENCOUNTER — Emergency Department (HOSPITAL_COMMUNITY)

## 2020-03-17 ENCOUNTER — Emergency Department (HOSPITAL_COMMUNITY)
Admission: EM | Admit: 2020-03-17 | Discharge: 2020-03-17 | Disposition: A | Discharge status: Home / Self Care | Attending: Emergency Medicine | Admitting: Emergency Medicine

## 2020-03-17 ENCOUNTER — Other Ambulatory Visit: Payer: Self-pay

## 2020-03-17 DIAGNOSIS — Z7982 Long term (current) use of aspirin: Secondary | ICD-10-CM | POA: Diagnosis not present

## 2020-03-17 DIAGNOSIS — E119 Type 2 diabetes mellitus without complications: Secondary | ICD-10-CM | POA: Insufficient documentation

## 2020-03-17 DIAGNOSIS — Z7984 Long term (current) use of oral hypoglycemic drugs: Secondary | ICD-10-CM | POA: Diagnosis not present

## 2020-03-17 DIAGNOSIS — J45909 Unspecified asthma, uncomplicated: Secondary | ICD-10-CM | POA: Diagnosis not present

## 2020-03-17 DIAGNOSIS — I10 Essential (primary) hypertension: Secondary | ICD-10-CM | POA: Diagnosis not present

## 2020-03-17 DIAGNOSIS — R262 Difficulty in walking, not elsewhere classified: Secondary | ICD-10-CM | POA: Diagnosis not present

## 2020-03-17 DIAGNOSIS — R531 Weakness: Secondary | ICD-10-CM | POA: Insufficient documentation

## 2020-03-17 DIAGNOSIS — R42 Dizziness and giddiness: Secondary | ICD-10-CM | POA: Diagnosis not present

## 2020-03-17 DIAGNOSIS — Z79899 Other long term (current) drug therapy: Secondary | ICD-10-CM | POA: Insufficient documentation

## 2020-03-17 LAB — CBC WITH DIFFERENTIAL/PLATELET
Abs Immature Granulocytes: 0.01 10*3/uL (ref 0.00–0.07)
Basophils Absolute: 0 10*3/uL (ref 0.0–0.1)
Basophils Relative: 1 %
Eosinophils Absolute: 0.1 10*3/uL (ref 0.0–0.5)
Eosinophils Relative: 3 %
HCT: 34.9 % — ABNORMAL LOW (ref 36.0–46.0)
Hemoglobin: 10.1 g/dL — ABNORMAL LOW (ref 12.0–15.0)
Immature Granulocytes: 0 %
Lymphocytes Relative: 29 %
Lymphs Abs: 1.3 10*3/uL (ref 0.7–4.0)
MCH: 21.9 pg — ABNORMAL LOW (ref 26.0–34.0)
MCHC: 28.9 g/dL — ABNORMAL LOW (ref 30.0–36.0)
MCV: 75.5 fL — ABNORMAL LOW (ref 80.0–100.0)
Monocytes Absolute: 0.2 10*3/uL (ref 0.1–1.0)
Monocytes Relative: 5 %
Neutro Abs: 2.9 10*3/uL (ref 1.7–7.7)
Neutrophils Relative %: 62 %
Platelets: 313 10*3/uL (ref 150–400)
RBC: 4.62 MIL/uL (ref 3.87–5.11)
RDW: 14.5 % (ref 11.5–15.5)
WBC: 4.6 10*3/uL (ref 4.0–10.5)
nRBC: 0 % (ref 0.0–0.2)

## 2020-03-17 LAB — COMPREHENSIVE METABOLIC PANEL
ALT: 15 U/L (ref 0–44)
AST: 19 U/L (ref 15–41)
Albumin: 4.4 g/dL (ref 3.5–5.0)
Alkaline Phosphatase: 121 U/L (ref 38–126)
Anion gap: 9 (ref 5–15)
BUN: 21 mg/dL — ABNORMAL HIGH (ref 6–20)
CO2: 25 mmol/L (ref 22–32)
Calcium: 9.5 mg/dL (ref 8.9–10.3)
Chloride: 103 mmol/L (ref 98–111)
Creatinine, Ser: 0.88 mg/dL (ref 0.44–1.00)
GFR, Estimated: >60 mL/min (ref >60)
Glucose, Bld: 157 mg/dL — ABNORMAL HIGH (ref 70–99)
Potassium: 4.1 mmol/L (ref 3.5–5.1)
Sodium: 137 mmol/L (ref 135–145)
Total Bilirubin: 0.6 mg/dL (ref 0.3–1.2)
Total Protein: 7.8 g/dL (ref 6.5–8.1)

## 2020-03-17 LAB — URINALYSIS, ROUTINE W REFLEX MICROSCOPIC
Bilirubin Urine: NEGATIVE
Glucose, UA: NEGATIVE mg/dL
Hgb urine dipstick: NEGATIVE
Ketones, ur: NEGATIVE mg/dL
Leukocytes,Ua: NEGATIVE
Nitrite: NEGATIVE
Protein, ur: NEGATIVE mg/dL
Specific Gravity, Urine: 1.01 (ref 1.005–1.030)
pH: 6 (ref 5.0–8.0)

## 2020-03-17 MED ORDER — LORAZEPAM 2 MG/ML IJ SOLN: 1.0000 mg | Freq: Once | INTRAMUSCULAR | Status: DC

## 2020-03-17 MED ORDER — MECLIZINE HCL 25 MG PO TABS
25.0000 mg | ORAL_TABLET | Freq: Once | ORAL | Status: AC
  Administered 2020-03-17: 25 mg via ORAL
  Filled 2020-03-17: qty 1

## 2020-03-17 MED ORDER — MECLIZINE HCL 25 MG PO TABS: 25.0000 mg | ORAL_TABLET | Freq: Three times a day (TID) | ORAL | 0 refills | Status: AC | PRN

## 2020-03-17 MED ORDER — LORAZEPAM 2 MG/ML IJ SOLN
0.5000 mg | Freq: Once | INTRAMUSCULAR | Status: AC
  Administered 2020-03-17: 0.5 mg via INTRAVENOUS
  Filled 2020-03-17: qty 1

## 2020-03-17 NOTE — ED Provider Notes (Signed)
[facility] DEPT Provider Note   CSN: [id number] Arrival date & time: 03/17/20  0847     History Chief Complaint  Patient presents with  . Dizziness  . Nausea  . Headache    Rose Scott is a 54 y.o. female with PMH/o anemia, asthma, DM, HTN who presents for evaluation of dizziness/lightheadedness, difficulty walking, RUE weakness. She states she first noticed the dizziness/lightheadedness at about 8am yesterday morning. She states she felt like it was difficult to walk secondary to the symptoms. She said it slowly resolved and then felt like it was better. Then last night, the symptoms returned and have been persistent since then. She states she feels like she has to hold onto something in order to walk, which is abnormal for her. She also feels like her RUE is weak but she states she sometimes feels like that since she has gotten carpal tunnel surgery. She went to [facility] this AM where she was evaluated and was sent to the ED for further evaluation of her symptoms. She has had some occasional blurry vision, but no vision loss or double vision. No difficulty speaking. She denies any fevers, CP, SOB, abdominal pain, nausea/vomiting, numbness to her extremities.   The history is provided by the patient.       Past Medical History:  Diagnosis Date  . Anemia   . Asthma   . Diabetes mellitus without complication ([place]) 53/66/4403   per Arrie Eastern, [place] office note; type 2 on metformins , does not regulary check  blood sugar at home   . Environmental allergies   . GERD (gastroesophageal reflux disease)   . Hypertension     Patient Active Problem List   Diagnosis Date Noted  . Postmenopausal bleeding 10/15/2018  . Bilateral ovarian cysts 10/15/2018  . Obesity (BMI 35.0-39.9 without comorbidity) 10/15/2018  . Unspecified disorder of menstruation and other abnormal bleeding from female genital tract 07/01/2013  . Leiomyoma of uterus, unspecified 03/10/2013   . Other and unspecified ovarian cyst 03/10/2013  . Glucose intolerance (impaired glucose tolerance) 04/21/2012    Past Surgical History:  Procedure Laterality Date  . ABLATION  2011   endometrial  . colonoscopy     . ROBOTIC ASSISTED TOTAL HYSTERECTOMY WITH BILATERAL SALPINGO OOPHERECTOMY Bilateral 10/15/2018   Procedure: XI ROBOTIC ASSISTED TOTAL HYSTERECTOMY WITH BILATERAL SALPINGO OOPHORECTOMY;  Surgeon: Everitt Amber, MD;  Location: [facility];  Service: Gynecology;  Laterality: Bilateral;  . TUBAL LIGATION  1995  . UPPER GI ENDOSCOPY       OB History    Gravida  2   Para  2   Term  2   Preterm      AB      Living  2     SAB      TAB      Ectopic      Multiple      Live Births  2           Family History  Problem Relation Age of Onset  . Diabetes Mother   . Breast cancer Mother        in 57's  . COPD Father   . Congestive Heart Failure Father   . Diabetes Maternal Grandmother   . Hypertension Maternal Grandmother   . Diabetes Maternal Grandfather   . Diabetes Paternal Grandmother   . Breast cancer Maternal Aunt   . Seizures Neg Hx     Social History   Tobacco Use  .  Smoking status: Never Smoker  . Smokeless tobacco: Never Used  Vaping Use  . Vaping Use: Never used  Substance Use Topics  . Alcohol use: Yes    Comment: rare occ 1/2 glass of red wine  . Drug use: No    Home Medications Prior to Admission medications   Medication Sig Start Date End Date Taking? Authorizing Provider  albuterol (PROVENTIL HFA;VENTOLIN HFA) 108 (90 Base) MCG/ACT inhaler Inhale 2 puffs into the lungs 4 (four) times daily as needed for wheezing or shortness of breath.  05/08/18   [provider]  aspirin EC 81 MG tablet Take 81 mg by mouth daily.    [provider]  Cholecalciferol (VITAMIN D3) 10 MCG (400 UNIT) CAPS Take 400 Units by mouth daily as needed (low d3 labs).    [provider]  Ferrous Sulfate (IRON PO) Take 1 tablet by mouth  daily as needed (low iron per labs).    [provider]  ibuprofen (ADVIL) 600 MG tablet Take 1 tablet (600 mg total) by mouth every 6 (six) hours as needed. For AFTER surgery 09/23/18   Dorothyann Gibbs, NP  Infant Care Products (BABY SHAMPOO EX) Apply 1 application to eye daily. Cleanse eyelids daily    [provider]  loratadine (CLARITIN) 10 MG tablet Take 10 mg by mouth daily as needed (allergies.).  12/03/17 12/03/18  [provider]  losartan-hydrochlorothiazide (HYZAAR) 50-12.5 MG tablet Take 1 tablet by mouth every evening.  02/27/18   [provider]  magnesium oxide (MAG-OX) 400 MG tablet Take 400 mg by mouth daily as needed (low magnesium per labs.).    [provider]  meclizine (ANTIVERT) 25 MG tablet Take 1 tablet (25 mg total) by mouth 3 (three) times daily as needed for dizziness. 03/17/20   Volanda Napoleon, PA-C  metFORMIN (GLUCOPHAGE-XR) 500 MG 24 hr tablet Take 500 mg by mouth at bedtime.  02/27/18   [provider]  oxyCODONE (OXY IR/ROXICODONE) 5 MG immediate release tablet Take 1 tablet (5 mg total) by mouth every 4 (four) hours as needed for severe pain. For AFTER surgery, do not take and drive 10/28/22   Cross, Lenna Sciara D, NP  pantoprazole (PROTONIX) 40 MG tablet Take 40 mg by mouth daily before breakfast.    [provider]  Polyethyl Glycol-Propyl Glycol (SYSTANE) 0.4-0.3 % SOLN Place 1 drop into both eyes 2 (two) times daily as needed (dry eyes.).    [provider]  senna-docusate (SENOKOT-S) 8.6-50 MG tablet Take 2 tablets by mouth at bedtime. For AFTER surgery, do not take if having diarrhea 09/23/18   Joylene John D, NP  vitamin C (ASCORBIC ACID) 500 MG tablet Take 500 mg by mouth daily as needed (low c per labs).    [provider]    Allergies    Multihance [gadobenate] and Tape  Review of Systems   Review of Systems  Constitutional: Negative for fever.  Respiratory: Negative for cough  and shortness of breath.   Cardiovascular: Negative for chest pain.  Gastrointestinal: Negative for abdominal pain, nausea and vomiting.  Genitourinary: Negative for dysuria and hematuria.  Neurological: Positive for dizziness, weakness and light-headedness. Negative for numbness and headaches.  All other systems reviewed and are negative.   Physical Exam Updated Vital Signs BP 140/76   Pulse 60   Temp 98.1 F (36.7 C) (Oral)   Resp 13   Ht 5\' 4"  (1.626 m)   Wt 89.4 kg   SpO2  100%   BMI 33.81 kg/m   Physical Exam Vitals and nursing note reviewed.  Constitutional:      Appearance: Normal appearance. She is well-developed.  HENT:     Head: Normocephalic and atraumatic.  Eyes:     General: Lids are normal.     Conjunctiva/sclera: Conjunctivae normal.     Pupils: Pupils are equal, round, and reactive to light.     Comments: PERRL. EOMs intact. No nystagmus. No neglect.   Cardiovascular:     Rate and Rhythm: Normal rate and regular rhythm.     Pulses: Normal pulses.          Radial pulses are 2+ on the right side and 2+ on the left side.     Heart sounds: Normal heart sounds. No murmur heard.  No friction rub. No gallop.   Pulmonary:     Effort: Pulmonary effort is normal.     Breath sounds: Normal breath sounds.     Comments: Lungs clear to auscultation bilaterally.  Symmetric chest rise.  No wheezing, rales, rhonchi. Abdominal:     Palpations: Abdomen is soft. Abdomen is not rigid.     Tenderness: There is no abdominal tenderness. There is no guarding.     Comments: Abdomen is soft, non-distended, non-tender. No rigidity, No guarding. No peritoneal signs.  Musculoskeletal:        General: Normal range of motion.     Cervical back: Full passive range of motion without pain.  Skin:    General: Skin is warm and dry.     Capillary Refill: Capillary refill takes less than 2 seconds.  Neurological:     Mental Status: She is alert and oriented to person, place, and time.      Comments: Cranial nerves III-XII intact Follows commands, Moves all extremities  5/5 strength to LUE and BLE. Slight decreased grip strength noted to the RUE.  Sensation intact throughout all major nerve distributions Normal finger to nose. No dysdiadochokinesia. No pronator drift. Slight deviation to the right with ambulation. Positive Romberg.  No slurred speech. No facial droop.   Psychiatric:        Speech: Speech normal.     ED Results / Procedures / Treatments   Labs (all labs ordered are listed, but only abnormal results are displayed) Labs Reviewed  COMPREHENSIVE METABOLIC PANEL - Abnormal; Notable for the following components:      Result Value   Glucose, Bld 157 (*)    BUN 21 (*)    All other components within normal limits  CBC WITH DIFFERENTIAL/PLATELET - Abnormal; Notable for the following components:   Hemoglobin 10.1 (*)    HCT 34.9 (*)    MCV 75.5 (*)    MCH 21.9 (*)    MCHC 28.9 (*)    All other components within normal limits  URINALYSIS, ROUTINE W REFLEX MICROSCOPIC - Abnormal; Notable for the following components:   Color, Urine STRAW (*)    All other components within normal limits    EKG EKG Interpretation  Date/Time:  Friday March 17 2020 09:08:15 EST Ventricular Rate:  65 PR Interval:    QRS Duration: 85 QT Interval:  433 QTC Calculation: 451 R Axis:   16 Text Interpretation: Sinus rhythm No acute changes No significant change since last tracing Confirmed by Varney Biles ([id number]) on 03/17/2020 12:44:20 PM   Radiology CT Head Wo Contrast  Result Date: 03/17/2020 CLINICAL DATA:  Dizziness. EXAM: CT HEAD WITHOUT CONTRAST TECHNIQUE: Contiguous axial images were  obtained from the base of the skull through the vertex without intravenous contrast. COMPARISON:  April 11, 2017. FINDINGS: Brain: No evidence of acute large vascular territory infarction, hemorrhage, hydrocephalus, extra-axial collection or mass lesion/mass effect. Mild white  matter hypoattenuation, nonspecific but most likely representing chronic microvascular ischemic change. Vascular: No hyperdense vessel or unexpected calcification. Skull: Normal. Negative for fracture or focal lesion. Sinuses/Orbits: No acute finding. Other: None. IMPRESSION: No evidence of acute intracranial abnormality. Electronically Signed   By: Margaretha Sheffield MD   On: 03/17/2020 10:38   MR BRAIN WO CONTRAST  Result Date: 03/17/2020 CLINICAL DATA:  Dizziness, non-specific EXAM: MRI HEAD WITHOUT CONTRAST TECHNIQUE: Multiplanar, multiecho pulse sequences of the brain and surrounding structures were obtained without intravenous contrast. COMPARISON:  03/17/2020 head CT and prior.  05/25/2017 MRI head. FINDINGS: Brain: No diffusion-weighted signal abnormality. No intracranial hemorrhage. No midline shift, ventriculomegaly or extra-axial fluid collection. No mass lesion. Cerebral volume is within normal limits. Minimal scattered supratentorial white matter T2 hyperintense foci, grossly unchanged. Vascular: Normal flow voids. Skull and upper cervical spine: Normal marrow signal. Sinuses/Orbits: Normal orbits. Clear paranasal sinuses. No mastoid effusion. Other: None. IMPRESSION: 1. No acute intracranial process. 2. Minimal supratentorial white matter lesions are unchanged, likely chronic microvascular ischemic changes. Electronically Signed   By: Primitivo Gauze M.D.   On: 03/17/2020 11:20    Procedures Procedures (including critical care time)  Medications Ordered in ED Medications  meclizine (ANTIVERT) tablet 25 mg (25 mg Oral Given 03/17/20 1004)  LORazepam (ATIVAN) injection 0.5 mg (0.5 mg Intravenous Given 03/17/20 1031)    ED Course  I have reviewed the triage vital signs and the nursing notes.  Pertinent labs & imaging results that were available during my care of the patient were reviewed by me and considered in my medical decision making (see chart for details).    MDM  Rules/Calculators/A&P                          54 y.o. F with PMH/o DM, HTN who presents for evaluation of dizziness/lightheadedness, RUE weakness that has been ongoing since last night. She reports initially she started experiencing lightheadedness/dizziness yesterday morning at 8am but states it eventually went away. Symptoms returned (as well as RUE weakness) last night and have been persistent then. Patient is afebrile, non-toxic appearing, sitting comfortably on examination table. Vital signs reviewed. She is slightly hypertensive. Given that CVA is on the differential, will allow for permissive hypertension. She doesn't have any classic nystagmus that would be indicative of peripheral vertigo but is a consideration. Also consider infectious etiology vs CVA. Will obtain labs, imaging.  She is outside the tpa window and is therefore not a candidate.   UA negative for infectious etiology. CMP shows glucose of 157. CBC shows no leukocytosis. Hgb is 10.1.   CT head negative for any acute abnormality. MRI brain shows no acute intracranial process. She has minimal supratentorial white matter lesions, which are unchanged.   Discussed results with patient. She states she feels slightly better. I ambulated patient. She did not seem like she was going to the right. She states she felt like it was better. She states is not completely 100%, but she states it felt better than when she first came in. Romberg stoplight slightly positive. She does me that she had some fluid in her ear about a few weeks ago. Question if this caused a peripheral vertigo. She does not have any nystagmus  but did respond well to meclizine and Ativan.  Discussed patient with Dr. Irish Elders (Neuro). Agree with plan for treatment of positional vertigo, outpatient follow-up.  Discussed results with patient.  Patient states she feels comfortable with this plan.  She does feel like her walking has improved and on my evaluation, she is not  leaning to the right as much as she has.  I discussed with her regarding meclizine use as well as outpatient neurology follow-up. At this time, patient exhibits no emergent life-threatening condition that require further evaluation in ED. Patient had ample opportunity for questions and discussion. All patient's questions were answered with full understanding. Strict return precautions discussed. Patient expresses understanding and agreement to plan.   Portions of this note were generated with [employer]. Dictation errors may occur despite best attempts at proofreading.   Final Clinical Impression(s) / ED Diagnoses Final diagnoses:  Dizziness    Rx / DC Orders ED Discharge Orders         Ordered    Ambulatory referral to Neurology       Comments: An appointment is requested in approximately: 2 weeks   03/17/20 1417    meclizine (ANTIVERT) 25 MG tablet  3 times daily PRN        03/17/20 1419           Volanda Napoleon, PA-C 03/17/20 [address], [place], MD 03/18/20 [id number]

## 2020-03-17 NOTE — Discharge Instructions (Signed)
As we discussed, your work-up today was reassuring. Your MRI did not show any evidence of a stroke.  We will plan to treat this as vertigo. Take meclizine as directed.  Follow-up with outpatient neurology.  Return the emergency department for any speech difficulty, vision changes, difficulty walking, numbness/weakness of your arms or legs, facial drooping worsening concerning symptoms.

## 2020-03-17 NOTE — ED Triage Notes (Signed)
Pt reports being send here from urgent care due to having stroke like sxs. Pt reports having dizziness, nauseated,  lightheadedness, headache and leaning to the right that started yesterday at 0800.

## 2020-04-25 ENCOUNTER — Encounter: Payer: Self-pay | Admitting: Family Medicine

## 2020-04-25 ENCOUNTER — Ambulatory Visit: Admitting: Family Medicine

## 2020-04-25 ENCOUNTER — Ambulatory Visit (INDEPENDENT_AMBULATORY_CARE_PROVIDER_SITE_OTHER): Admitting: Family Medicine

## 2020-04-25 VITALS — BP 163/88 | HR 69 | Ht 64.0 in | Wt 203.0 lb

## 2020-04-25 DIAGNOSIS — R4189 Other symptoms and signs involving cognitive functions and awareness: Secondary | ICD-10-CM | POA: Diagnosis not present

## 2020-04-25 DIAGNOSIS — I10 Essential (primary) hypertension: Secondary | ICD-10-CM

## 2020-04-25 DIAGNOSIS — R42 Dizziness and giddiness: Secondary | ICD-10-CM

## 2020-04-25 NOTE — Progress Notes (Addendum)
Chief Complaint  Patient presents with  . Follow-up    Rm, 2, alone, pt reports mild headaches and some dizziness, states she feels fine today     HISTORY OF PRESENT ILLNESS: Today 04/25/20  Rose Scott is a 55 y.o. female here today for follow up for  . She was last seen by Dr Lucia Gaskins in 04/2017 for dizziness, stabbing headaches, LOC with sweating and graying of vision, and paresthesias of right thigh. MRI was normal. She declined EEG. 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   She was seen by the ER on 11/26. She reports that she was having difficulty with dizziness, feeling that she was leaning to the left, imbalance. No confusion or trouble with memory. MRI was unremarkable with exception of microvascular ischemia. She was diagnosed with vertigo and given meclizine that seemed to help. She reports that DM is fairly well managed. She denies recent episodes of LOC or syncopal events. She does note fluctuating BP. She takes Hyzaar at night. She feels that she has trouble remembering certain things. Her mother and grandmother suffered from AD. She works full time as a Biochemist, clinical. She is married and has a daughter. She talks to her sister daily. She feels she has a good support system. She does not snore. She sleeps fairly well.    HISTORY (copied from previous note)   HPI:  Rose Scott is a 55 y.o. female here as a referral from Dr. Allyne Gee for headache.  Past medical history of diabetes.  Symptoms started on 12/19, no inciting events or trigger, no illnesses.The right side of the head started feeling tingling and stabbing on the right sie of the head like sharp pain and needles(points to the parieto-ooccipital area). But she was bitten by something, she has swelling on her neck and on the leg around the same time. She has numbness in tingling in the hands but that has been ongoing for a while, Right-sided symptoms started with the headache.  She says the right side of her body aches. It is "off and on", feels better with alleve, worse In the morning with numbnes sin the hands. She feels like she drags her right leg. She has noticed blurry vision. She has a remote history of migraines, she did not experience light or sound sensitivity with the most recent headache. No back pain or neck pain. She also has had episodes of loss of consciousness, with sweating, feeling dizzy, then graying out of vision and complete LOC. Unknown seizure-like activity.Multiple times. No other focal neurologic deficits, associated symptoms, inciting events or modifiable factors.  Reviewed notes, labs and imaging from outside physicians, which showed:  CMP with BUN 20 and creatinine 0.96  Patient was seen in the emergency room in December 2018 for strokelike symptoms.  The symptoms were in the setting of a recurrent headache, sharp shooting pain involving the right side of the head lasting for seconds and sporadically through the day.  Mild headache, no reported vision changes, confusion.  Also intermittent tingling sensation to the fingertips of her right hand for the past week, also throbbing pain to her right arm and right leg for the same duration.  She felt her right leg was more swollen.  No history of alcohol or tobacco abuse.  Exam showed tenderness of the right upper extremity throughout the entire arm without focal point tenderness, also right lower extremity mild tenderness throughout the entire extremity, no weakness.  Ct showed  No acute intracranial abnormalities including mass lesion or mass effect, hydrocephalus, extra-axial fluid collection, midline shift, hemorrhage, or acute infarction, large ischemic events (personally reviewed images)   REVIEW OF SYSTEMS: Out of a complete 14 system review of symptoms, the patient complains only of the following symptoms, memory loss, dizziness, imbalance, mild headaches and all other reviewed systems are  negative.   ALLERGIES: Allergies  Allergen Reactions  . Multihance [Gadobenate] Nausea And Vomiting, Other (See Comments) and Cough    Pt became nauseous and started vomiting a couple of minutes after 19 mL Multihance given. Pt stated that her throat felt tight and like fluid was stuck in her throat. Pt's eyes were red. Dr. Joneen Caraway assessed pt and gave pt benedryl. Pt began to feel better after vomiting and benedryl given.   . Tape Rash    Needs paper tape     HOME MEDICATIONS: Outpatient Medications Prior to Visit  Medication Sig Dispense Refill  . albuterol (PROVENTIL HFA;VENTOLIN HFA) 108 (90 Base) MCG/ACT inhaler Inhale 2 puffs into the lungs 4 (four) times daily as needed for wheezing or shortness of breath.     Marland Kitchen aspirin EC 81 MG tablet Take 81 mg by mouth daily.    . Cholecalciferol (VITAMIN D3) 10 MCG (400 UNIT) CAPS Take 400 Units by mouth daily as needed (low d3 labs).    Marland Kitchen ibuprofen (ADVIL) 600 MG tablet Take 1 tablet (600 mg total) by mouth every 6 (six) hours as needed. For AFTER surgery 30 tablet 0  . Infant Care Products (BABY SHAMPOO EX) Apply 1 application to eye daily. Cleanse eyelids daily    . losartan-hydrochlorothiazide (HYZAAR) 50-12.5 MG tablet Take 1 tablet by mouth every evening.     . magnesium oxide (MAG-OX) 400 MG tablet Take 400 mg by mouth daily as needed (low magnesium per labs.).    Marland Kitchen meclizine (ANTIVERT) 25 MG tablet Take 1 tablet (25 mg total) by mouth 3 (three) times daily as needed for dizziness. 30 tablet 0  . metFORMIN (GLUCOPHAGE-XR) 500 MG 24 hr tablet Take 500 mg by mouth at bedtime.     . pantoprazole (PROTONIX) 40 MG tablet Take 40 mg by mouth daily before breakfast.    . vitamin C (ASCORBIC ACID) 500 MG tablet Take 500 mg by mouth daily as needed (low c per labs).    . loratadine (CLARITIN) 10 MG tablet Take 10 mg by mouth daily as needed (allergies.).     Marland Kitchen Ferrous Sulfate (IRON PO) Take 1 tablet by mouth daily as needed (low iron per labs).     Marland Kitchen oxyCODONE (OXY IR/ROXICODONE) 5 MG immediate release tablet Take 1 tablet (5 mg total) by mouth every 4 (four) hours as needed for severe pain. For AFTER surgery, do not take and drive 10 tablet 0  . Polyethyl Glycol-Propyl Glycol (SYSTANE) 0.4-0.3 % SOLN Place 1 drop into both eyes 2 (two) times daily as needed (dry eyes.).    Marland Kitchen senna-docusate (SENOKOT-S) 8.6-50 MG tablet Take 2 tablets by mouth at bedtime. For AFTER surgery, do not take if having diarrhea 30 tablet 0   No facility-administered medications prior to visit.     PAST MEDICAL HISTORY: Past Medical History:  Diagnosis Date  . Anemia   . Asthma   . Diabetes mellitus without complication ([facility]) [id number]   per Arrie Eastern, PA office note; type 2 on metformins , does not regulary check  blood sugar at home   . Environmental allergies   . GERD (  gastroesophageal reflux disease)   . Hypertension      PAST SURGICAL HISTORY: Past Surgical History:  Procedure Laterality Date  . ABLATION  2011   endometrial  . colonoscopy     . ROBOTIC ASSISTED TOTAL HYSTERECTOMY WITH BILATERAL SALPINGO OOPHERECTOMY Bilateral 10/15/2018   Procedure: XI ROBOTIC ASSISTED TOTAL HYSTERECTOMY WITH BILATERAL SALPINGO OOPHORECTOMY;  Surgeon: Everitt Amber, MD;  Location: [facility];  Service: Gynecology;  Laterality: Bilateral;  . TUBAL LIGATION  1995  . UPPER GI ENDOSCOPY       FAMILY HISTORY: Family History  Problem Relation Age of Onset  . Diabetes Mother   . Breast cancer Mother        in 32's  . COPD Father   . Congestive Heart Failure Father   . Diabetes Maternal Grandmother   . Hypertension Maternal Grandmother   . Diabetes Maternal Grandfather   . Diabetes Paternal Grandmother   . Breast cancer Maternal Aunt   . Seizures Neg Hx      SOCIAL HISTORY: Social History   Socioeconomic History  . Marital status: Married    Spouse name: Not on file  . Number of children: 2  . Years of education: Not on file  . Highest education  level: Bachelor's degree (e.g., BA, AB, BS)  Occupational History  . Not on file  Tobacco Use  . Smoking status: Never Smoker  . Smokeless tobacco: Never Used  Vaping Use  . Vaping Use: Never used  Substance and Sexual Activity  . Alcohol use: Yes    Comment: rare occ 1/2 glass of red wine  . Drug use: No  . Sexual activity: Yes    Partners: Male    Birth control/protection: None, Surgical  Other Topics Concern  . Not on file  Social History Narrative   Lives at home with her spouse   Right handed   Drinks 2+ cups of caffeine daily   Social Determinants of Health   Financial Resource Strain: Not on file  Food Insecurity: Not on file  Transportation Needs: Not on file  Physical Activity: Not on file  Stress: Not on file  Social Connections: Not on file  Intimate Partner Violence: Not on file      PHYSICAL EXAM  Vitals:   04/25/20 0851  BP: (!) 163/88  Pulse: 69  Weight: 203 lb (92.1 kg)  Height: 5\' 4"  (1.626 m)   Body mass index is 34.84 kg/m.   Generalized: Well developed, in no acute distress  Cardiology: normal rate and rhythm, no murmur auscultated  Respiratory: clear to auscultation bilaterally    Neurological examination  Mentation: Alert oriented to time, place, history taking. Follows all commands speech and language fluent Cranial nerve II-XII: Pupils were equal round reactive to light. Extraocular movements were full, visual field were full on confrontational test. Facial sensation and strength were normal. Uvula tongue midline. Head turning and shoulder shrug  were normal and symmetric. Motor: The motor testing reveals 5 over 5 strength of all 4 extremities. Good symmetric motor tone is noted throughout.  Sensory: Sensory testing is intact to soft touch on all 4 extremities. No evidence of extinction is noted.  Coordination: Cerebellar testing reveals good finger-nose-finger and heel-to-shin bilaterally.  Gait and station: Gait is normal.   Reflexes: Deep tendon reflexes are symmetric and normal bilaterally.     DIAGNOSTIC DATA (LABS, IMAGING, TESTING) - I reviewed patient records, labs, notes, testing and imaging myself where available.  Lab Results  Component Value Date  WBC 4.6 03/17/2020   HGB 10.1 (L) 03/17/2020   HCT 34.9 (L) 03/17/2020   MCV 75.5 (L) 03/17/2020   PLT 313 03/17/2020      Component Value Date/Time   NA 137 03/17/2020 0957   K 4.1 03/17/2020 0957   CL 103 03/17/2020 0957   CO2 25 03/17/2020 0957   GLUCOSE 157 (H) 03/17/2020 0957   BUN 21 (H) 03/17/2020 0957   CREATININE 0.88 03/17/2020 0957   CREATININE 0.80 05/17/2013 1156   CALCIUM 9.5 03/17/2020 0957   PROT 7.8 03/17/2020 0957   ALBUMIN 4.4 03/17/2020 0957   AST 19 03/17/2020 0957   ALT 15 03/17/2020 0957   ALKPHOS 121 03/17/2020 0957   BILITOT 0.6 03/17/2020 0957   GFRNONAA >60 03/17/2020 0957   GFRAA >60 10/09/2018 0930   No results found for: CHOL, HDL, LDLCALC, LDLDIRECT, TRIG, CHOLHDL Lab Results  Component Value Date   HGBA1C 6.7 (H) 10/09/2018   No results found for: [id number] Lab Results  Component Value Date   TSH 1.468 05/17/2013    No flowsheet data found.   ASSESSMENT AND PLAN  55 y.o. year old female  has a past medical history of Anemia, Asthma, Diabetes mellitus without complication ([facility]) (02/28/2018), Environmental allergies, GERD (gastroesophageal reflux disease), and Hypertension. here with   Vertigo  Benign essential hypertension  Subjective memory complaints  Mrs. Swinson presents today with concerns of dizziness, imbalance, mild headaches and subjective memory loss.  Fortunately, symptoms have pretty much resolved since ER evaluation on 03/17/2020.  She has had similar symptoms that wax and wane over the past 3 years.  Work-up has been unremarkable.  We have discussed MRI findings of microvascular ischemia.  I have advised that she keep a close eye on her blood pressure.  She will follow-up  closely with primary care for management of comorbidities.  Adequate hydration, well-balanced diet and regular exercise advised.  We have discussed concerns of memory loss.  Her mother and grandmother suffered from dementia.  No concerns of atrophy on MRI.  Memory compensation strategies reviewed.  She may continue meclizine as needed for vertiginous symptoms.  May also consider vestibular therapy if symptoms return or worsen.  She may follow-up with [place] as needed.  She verbalizes understanding and agreement with this plan.   No orders of the defined types were placed in this encounter.     I spent 30 minutes of face-to-face and non-face-to-face time with patient.  This included previsit chart review, lab review, study review, order entry, electronic health record documentation, patient education.    Shawnie Dapper, MSN, FNP-C 04/25/2020, 9:35 AM  [facility] [address] [place], [place] [address] [phone]   Made any corrections needed, and agree with history, physical, neuro exam,assessment and plan as stated.     Naomie Dean, MD [facility]

## 2020-04-25 NOTE — Patient Instructions (Signed)
Below is our plan:  We will continue to monitor symptoms. Please make sure you are keeping a close eye on your blood pressure. Avoid eating too much sodium. Continue close follow up with PCP for management of blood pressure, cholesterol and diabetes. Review memory compensation strategies below. Consider vestibular therapy if vertigo returns or worsens. meclizine can help but will likely make you sleepy.   Please make sure you are staying well hydrated. I recommend 50-60 ounces daily. Try to get at least 6-8 hours of sleep every night. Well balanced diet and regular exercise encouraged. Consider speaking with a therapist if stress levels are high.   Please continue follow up with care team as directed.   Follow up with [place] as needed.   You may receive a survey regarding today's visit. I encourage you to leave honest feed back as I do use this information to improve patient care. Thank you for seeing me today!     Memory Compensation Strategies  1. Use "WARM" strategy.  W= write it down  A= associate it  R= repeat it  M= make a mental note  2.   You can keep a Social worker.  Use a 3-ring notebook with sections for the following: calendar, important names and phone numbers,  medications, doctors' names/phone numbers, lists/reminders, and a section to journal what you did  each day.   3.    Use a calendar to write appointments down.  4.    Write yourself a schedule for the day.  This can be placed on the calendar or in a separate section of the Memory Notebook.  Keeping a  regular schedule can help memory.  5.    Use medication organizer with sections for each day or morning/evening pills.  You may need help loading it  6.    Keep a basket, or pegboard by the door.  Place items that you need to take out with you in the basket or on the pegboard.  You may also want to  include a message board for reminders.  7.    Use sticky notes.  Place sticky notes with reminders in a place  where the task is performed.  For example: " turn off the  stove" placed by the stove, "lock the door" placed on the door at eye level, " take your medications" on  the bathroom mirror or by the place where you normally take your medications.  8.    Use alarms/timers.  Use while cooking to remind yourself to check on food or as a reminder to take your medicine, or as a  reminder to make a call, or as a reminder to perform another task, etc.  Hypertension, Adult Hypertension is another name for high blood pressure. High blood pressure forces your heart to work harder to pump blood. This can cause problems over time. There are two numbers in a blood pressure reading. There is a top number (systolic) over a bottom number (diastolic). It is best to have a blood pressure that is below 120/80. Healthy choices can help lower your blood pressure, or you may need medicine to help lower it. What are the causes? The cause of this condition is not known. Some conditions may be related to high blood pressure. What increases the risk?  Smoking.  Having type 2 diabetes mellitus, high cholesterol, or both.  Not getting enough exercise or physical activity.  Being overweight.  Having too much fat, sugar, calories, or salt (sodium) in your  diet.  Drinking too much alcohol.  Having long-term (chronic) kidney disease.  Having a family history of high blood pressure.  Age. Risk increases with age.  Race. You may be at higher risk if you are African American.  Gender. Men are at higher risk than women before age 57. After age 57, women are at higher risk than men.  Having obstructive sleep apnea.  Stress. What are the signs or symptoms?  High blood pressure may not cause symptoms. Very high blood pressure (hypertensive crisis) may cause: ? Headache. ? Feelings of worry or nervousness (anxiety). ? Shortness of breath. ? Nosebleed. ? A feeling of being sick to your stomach (nausea). ? Throwing  up (vomiting). ? Changes in how you see. ? Very bad chest pain. ? Seizures. How is this treated?  This condition is treated by making healthy lifestyle changes, such as: ? Eating healthy foods. ? Exercising more. ? Drinking less alcohol.  Your health care provider may prescribe medicine if lifestyle changes are not enough to get your blood pressure under control, and if: ? Your top number is above 130. ? Your bottom number is above 80.  Your personal target blood pressure may vary. Follow these instructions at home: Eating and drinking   If told, follow the DASH eating plan. To follow this plan: ? Fill one half of your plate at each meal with fruits and vegetables. ? Fill one fourth of your plate at each meal with whole grains. Whole grains include whole-wheat pasta, brown rice, and whole-grain bread. ? Eat or drink low-fat dairy products, such as skim milk or low-fat yogurt. ? Fill one fourth of your plate at each meal with low-fat (lean) proteins. Low-fat proteins include fish, chicken without skin, eggs, beans, and tofu. ? Avoid fatty meat, cured and processed meat, or chicken with skin. ? Avoid pre-made or processed food.  Eat less than 1,500 mg of salt each day.  Do not drink alcohol if: ? Your doctor tells you not to drink. ? You are pregnant, may be pregnant, or are planning to become pregnant.  If you drink alcohol: ? Limit how much you use to:  0-1 drink a day for women.  0-2 drinks a day for men. ? Be aware of how much alcohol is in your drink. In the [place] one drink equals one 12 oz bottle of beer (355 mL), one 5 oz glass of wine (148 mL), or one 1 oz glass of hard liquor (44 mL). Lifestyle   Work with your doctor to stay at a healthy weight or to lose weight. Ask your doctor what the best weight is for you.  Get at least 30 minutes of exercise most days of the week. This may include walking, swimming, or biking.  Get at least 30 minutes of exercise that  strengthens your muscles (resistance exercise) at least 3 days a week. This may include lifting weights or doing Pilates.  Do not use any products that contain nicotine or tobacco, such as cigarettes, e-cigarettes, and chewing tobacco. If you need help quitting, ask your doctor.  Check your blood pressure at home as told by your doctor.  Keep all follow-up visits as told by your doctor. This is important. Medicines  Take over-the-counter and prescription medicines only as told by your doctor. Follow directions carefully.  Do not skip doses of blood pressure medicine. The medicine does not work as well if you skip doses. Skipping doses also puts you at risk for problems.  Ask your doctor about side effects or reactions to medicines that you should watch for. Contact a doctor if you:  Think you are having a reaction to the medicine you are taking.  Have headaches that keep coming back (recurring).  Feel dizzy.  Have swelling in your ankles.  Have trouble with your vision. Get help right away if you:  Get a very bad headache.  Start to feel mixed up (confused).  Feel weak or numb.  Feel faint.  Have very bad pain in your: ? Chest. ? Belly (abdomen).  Throw up more than once.  Have trouble breathing. Summary  Hypertension is another name for high blood pressure.  High blood pressure forces your heart to work harder to pump blood.  For most people, a normal blood pressure is less than 120/80.  Making healthy choices can help lower blood pressure. If your blood pressure does not get lower with healthy choices, you may need to take medicine. This information is not intended to replace advice given to you by your health care provider. Make sure you discuss any questions you have with your health care provider. Document Revised: 12/17/2017 Document Reviewed: 12/17/2017 Elsevier Patient Education  2020 [employer]    Vertigo Vertigo is the feeling that you or the  things around you are moving when they are not. This feeling can come and go at any time. Vertigo often goes away on its own. This condition can be dangerous if it happens when you are doing activities like driving or working with machines. Your doctor will do tests to find the cause of your vertigo. These tests will also help your doctor decide on the best treatment for you. Follow these instructions at home: Eating and drinking      Drink enough fluid to keep your pee (urine) pale yellow.  Do not drink alcohol. Activity  Return to your normal activities as told by your doctor. Ask your doctor what activities are safe for you.  In the morning, first sit up on the side of the bed. When you feel okay, stand slowly while you hold onto something until you know that your balance is fine.  Move slowly. Avoid sudden body or head movements or certain positions, as told by your doctor.  Use a cane if you have trouble standing or walking.  Sit down right away if you feel dizzy.  Avoid doing any tasks or activities that can cause danger to you or others if you get dizzy.  Avoid bending down if you feel dizzy. Place items in your home so that they are easy for you to reach without leaning over.  Do not drive or use heavy machinery if you feel dizzy. General instructions  Take over-the-counter and prescription medicines only as told by your doctor.  Keep all follow-up visits as told by your doctor. This is important. Contact a doctor if:  Your medicine does not help your vertigo.  You have a fever.  Your problems get worse or you have new symptoms.  Your family or friends see changes in your behavior.  The feeling of being sick to your stomach gets worse.  Your vomiting gets worse.  You lose feeling (have numbness) in part of your body.  You feel prickling and tingling in a part of your body. Get help right away if:  You have trouble moving or talking.  You are always  dizzy.  You pass out (faint).  You get very bad headaches.  You feel  weak in your hands, arms, or legs.  You have changes in your hearing.  You have changes in how you see (vision).  You get a stiff neck.  Bright light starts to bother you. Summary  Vertigo is the feeling that you or the things around you are moving when they are not.  Your doctor will do tests to find the cause of your vertigo.  You may be told to avoid some tasks, positions, or movements.  Contact a doctor if your medicine is not helping, or if you have a fever, new symptoms, or a change in behavior.  Get help right away if you get very bad headaches, or if you have changes in how you speak, hear, or see. This information is not intended to replace advice given to you by your health care provider. Make sure you discuss any questions you have with your health care provider. Document Revised: 03/02/2018 Document Reviewed: 03/02/2018 Elsevier Patient Education  2020 [employer].

## 2020-08-08 ENCOUNTER — Other Ambulatory Visit: Payer: Self-pay | Admitting: Physician Assistant

## 2020-08-08 DIAGNOSIS — Z1231 Encounter for screening mammogram for malignant neoplasm of breast: Secondary | ICD-10-CM

## 2020-09-27 ENCOUNTER — Ambulatory Visit
Admission: RE | Admit: 2020-09-27 | Discharge: 2020-09-27 | Disposition: A | Discharge status: Home / Self Care | Source: Ambulatory Visit | Attending: Physician Assistant | Admitting: Physician Assistant

## 2020-09-27 ENCOUNTER — Other Ambulatory Visit: Payer: Self-pay

## 2020-09-27 DIAGNOSIS — Z1231 Encounter for screening mammogram for malignant neoplasm of breast: Secondary | ICD-10-CM

## 2021-09-18 IMAGING — MG MM DIGITAL SCREENING BILAT W/ TOMO AND CAD
8 series · 8 of 24 positions shown · non-contrast
Comparison: Previous exam(s).

CLINICAL DATA: Screening.

EXAM:
DIGITAL SCREENING BILATERAL MAMMOGRAM WITH TOMOSYNTHESIS AND CAD
TECHNIQUE: Bilateral screening digital craniocaudal and mediolateral oblique
mammograms were obtained. Bilateral screening digital breast
tomosynthesis was performed. The images were evaluated with
computer-aided detection.

[L CC synth-2D]
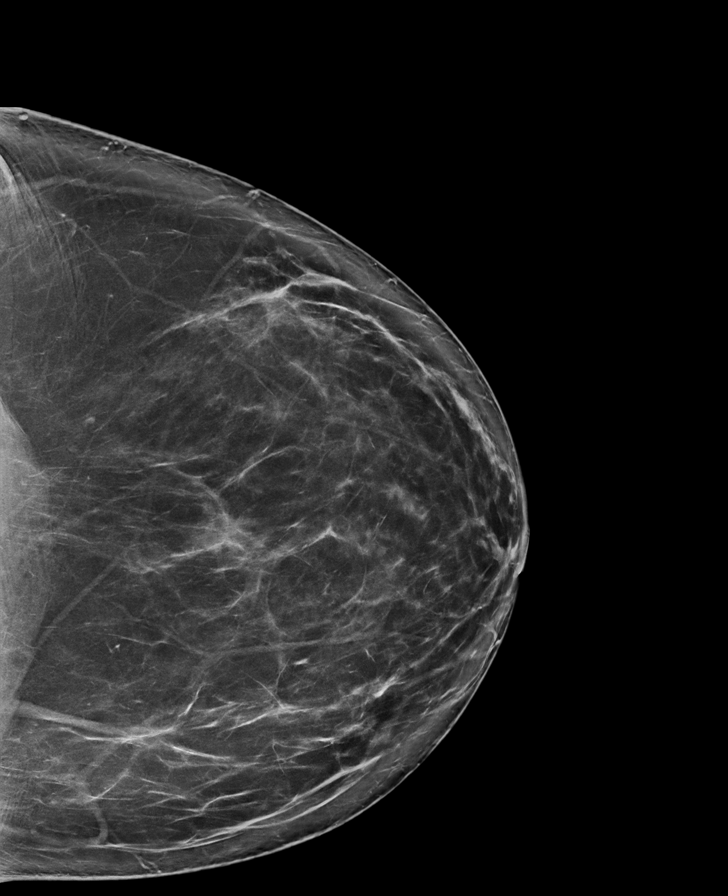

[R CC synth-2D]
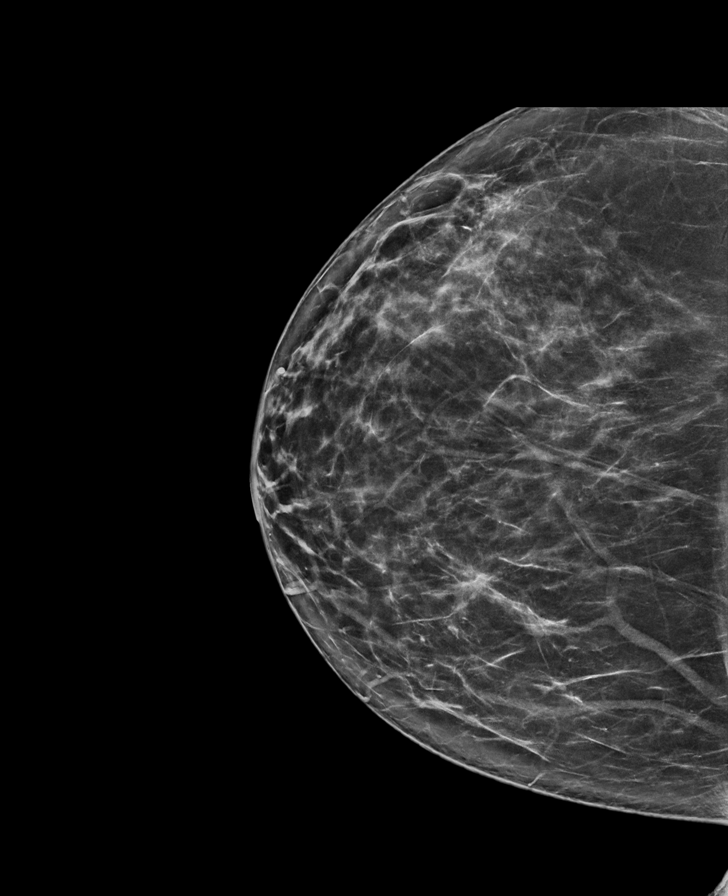

[L MLO synth-2D]
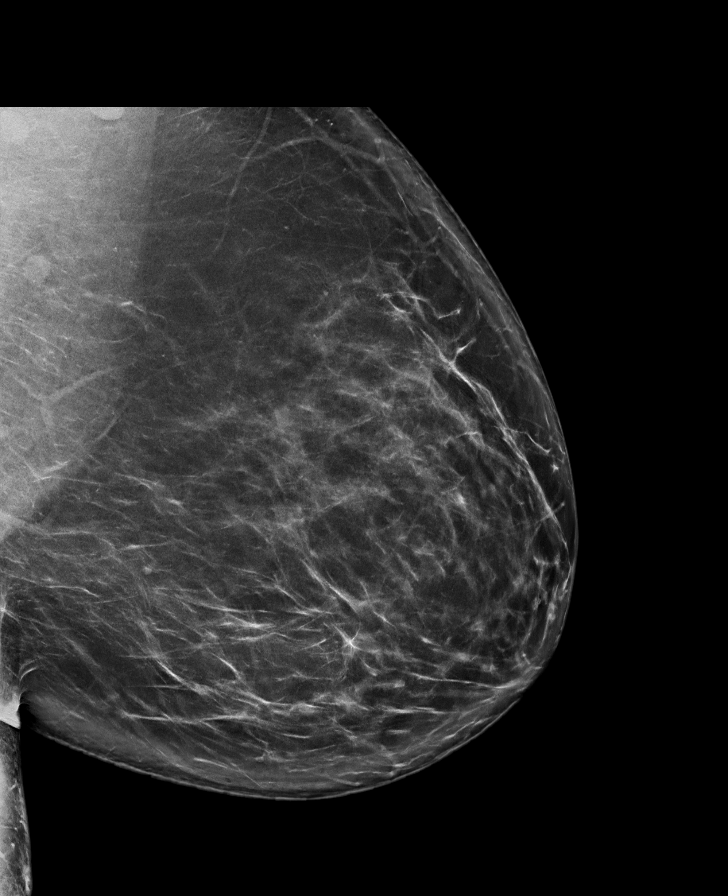

[R MLO synth-2D]
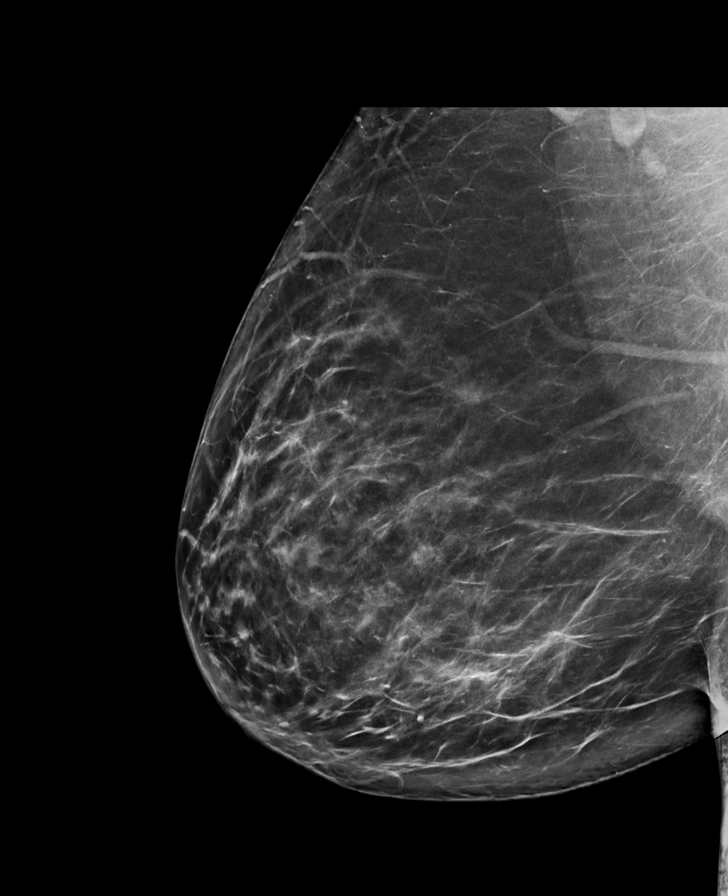

[L MLO tomo · tomo slice 51/100.0]
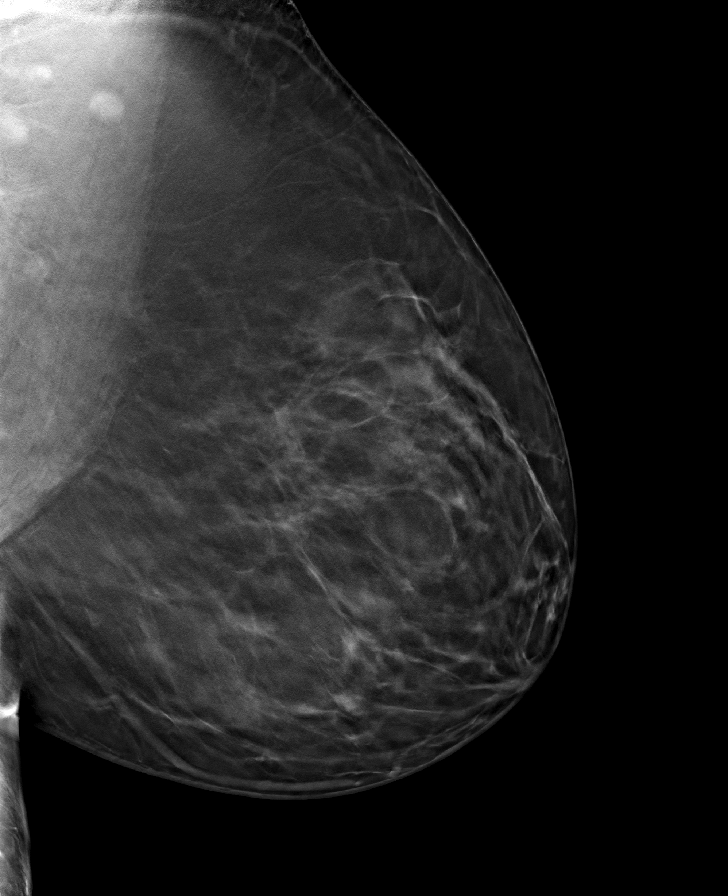

[R CC tomo · tomo slice 43/85.0]
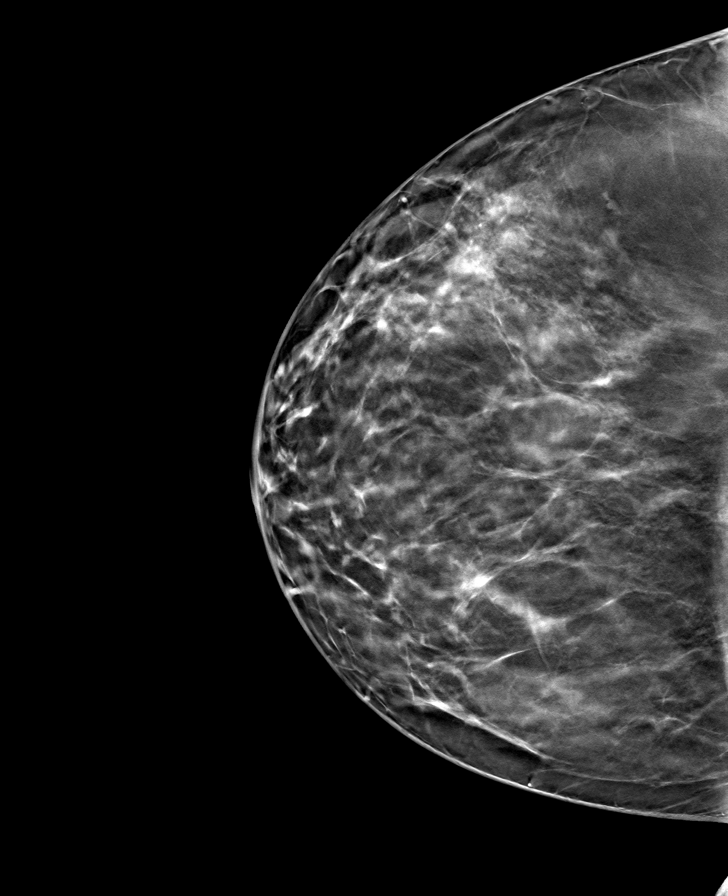

[R MLO tomo · tomo slice 49/97.0]
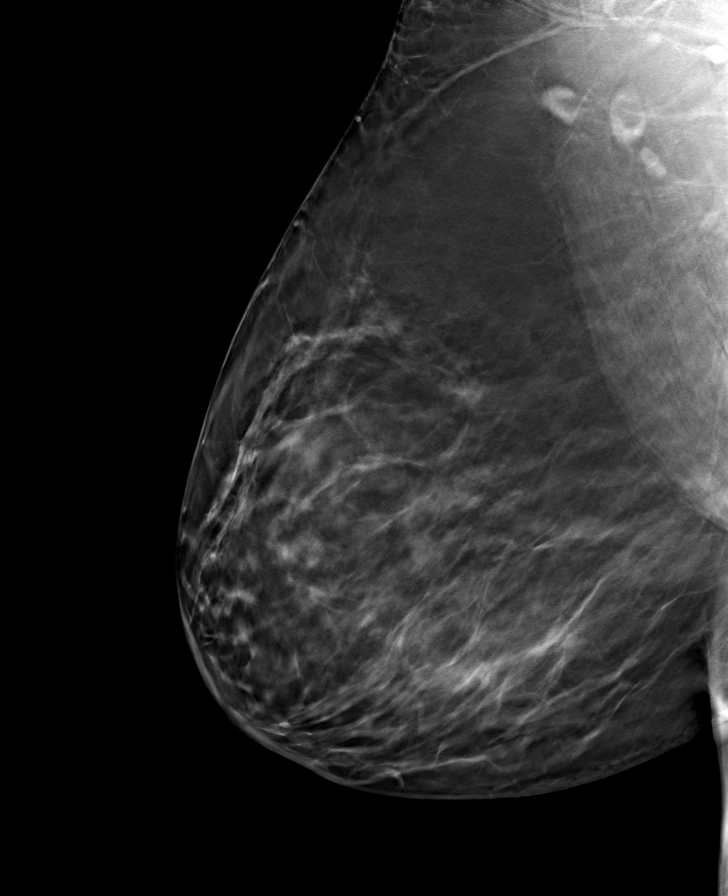

[L CC tomo · tomo slice 51/100.0]
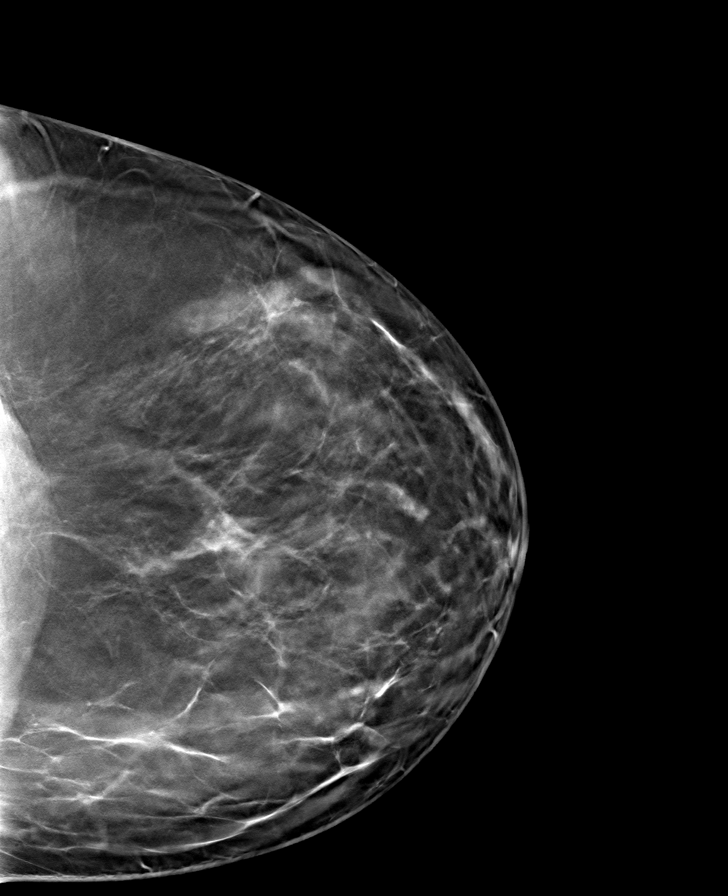

[8 of 24 positions shown; findings below may reference images not displayed]

ACR Breast Density Category b: There are scattered areas of
fibroglandular density.
FINDINGS: There are no findings suspicious for malignancy. The images were
evaluated with computer-aided detection.
IMPRESSION: No mammographic evidence of malignancy. A result letter of this
screening mammogram will be mailed directly to the patient.

RECOMMENDATION:
Screening mammogram in one year. (Code:[id number])

BI-RADS CATEGORY  1: Negative.
